# Patient Record
Sex: Male | Born: 1955 | Race: White | Hispanic: No | Marital: Married | Smoking: Current every day smoker
Health system: Southern US, Community
[De-identification: ages and names within clinical notes are randomized; demographics above are authoritative.]

## PROBLEM LIST (undated history)

## (undated) DIAGNOSIS — R079 Chest pain, unspecified: Secondary | ICD-10-CM

## (undated) DIAGNOSIS — N400 Enlarged prostate without lower urinary tract symptoms: Secondary | ICD-10-CM

## (undated) DIAGNOSIS — E119 Type 2 diabetes mellitus without complications: Secondary | ICD-10-CM

## (undated) DIAGNOSIS — M109 Gout, unspecified: Secondary | ICD-10-CM

## (undated) DIAGNOSIS — I10 Essential (primary) hypertension: Secondary | ICD-10-CM

## (undated) DIAGNOSIS — R9439 Abnormal result of other cardiovascular function study: Secondary | ICD-10-CM

## (undated) DIAGNOSIS — E785 Hyperlipidemia, unspecified: Secondary | ICD-10-CM

## (undated) HISTORY — DX: Type 2 diabetes mellitus without complications: E11.9

## (undated) HISTORY — PX: COLON RESECTION: SHX5231

## (undated) HISTORY — DX: Essential (primary) hypertension: I10

## (undated) HISTORY — DX: Hyperlipidemia, unspecified: E78.5

## (undated) HISTORY — DX: Abnormal result of other cardiovascular function study: R94.39

## (undated) HISTORY — DX: Gout, unspecified: M10.9

## (undated) HISTORY — PX: OTHER SURGICAL HISTORY: SHX169

## (undated) HISTORY — PX: CAROTID STENT: SHX1301

## (undated) HISTORY — DX: Chest pain, unspecified: R07.9

## (undated) HISTORY — DX: Benign prostatic hyperplasia without lower urinary tract symptoms: N40.0

---

## 2015-07-14 DIAGNOSIS — Z85038 Personal history of other malignant neoplasm of large intestine: Secondary | ICD-10-CM | POA: Diagnosis not present

## 2015-11-28 DIAGNOSIS — I209 Angina pectoris, unspecified: Secondary | ICD-10-CM

## 2015-11-28 HISTORY — DX: Angina pectoris, unspecified: I20.9

## 2015-12-29 ENCOUNTER — Ambulatory Visit (INDEPENDENT_AMBULATORY_CARE_PROVIDER_SITE_OTHER): Payer: BLUE CROSS/BLUE SHIELD | Admitting: Endocrinology

## 2015-12-29 ENCOUNTER — Encounter: Payer: Self-pay | Admitting: Endocrinology

## 2015-12-29 VITALS — BP 130/88 | HR 80 | Ht 65.0 in | Wt 214.0 lb

## 2015-12-29 DIAGNOSIS — N183 Chronic kidney disease, stage 3 (moderate): Secondary | ICD-10-CM

## 2015-12-29 DIAGNOSIS — E162 Hypoglycemia, unspecified: Secondary | ICD-10-CM

## 2015-12-29 DIAGNOSIS — M109 Gout, unspecified: Secondary | ICD-10-CM | POA: Diagnosis not present

## 2015-12-29 DIAGNOSIS — I10 Essential (primary) hypertension: Secondary | ICD-10-CM | POA: Diagnosis not present

## 2015-12-29 DIAGNOSIS — E785 Hyperlipidemia, unspecified: Secondary | ICD-10-CM

## 2015-12-29 DIAGNOSIS — E119 Type 2 diabetes mellitus without complications: Secondary | ICD-10-CM | POA: Insufficient documentation

## 2015-12-29 DIAGNOSIS — N400 Enlarged prostate without lower urinary tract symptoms: Secondary | ICD-10-CM | POA: Insufficient documentation

## 2015-12-29 DIAGNOSIS — E1122 Type 2 diabetes mellitus with diabetic chronic kidney disease: Secondary | ICD-10-CM

## 2015-12-29 LAB — GLUCOSE, POCT (MANUAL RESULT ENTRY): POC Glucose: 64 mg/dl — AB (ref 70–99)

## 2015-12-29 MED ORDER — BROMOCRIPTINE MESYLATE 2.5 MG PO TABS: ORAL_TABLET | ORAL | Status: DC

## 2015-12-29 MED ORDER — REPAGLINIDE 2 MG PO TABS: 2.0000 mg | ORAL_TABLET | Freq: Three times a day (TID) | ORAL | Status: DC

## 2015-12-29 NOTE — Progress Notes (Signed)
Subjective:    Patient ID: Tony Allen, male    DOB: 1955-11-05, 60 y.o.   MRN: [id number]  HPI pt states DM was dx'ed in 2012; he has moderate neuropathy of the lower extremities; he has associated renal failure and CAD; he has never been on insulin; pt says his diet and exercise are not good; he has never had pancreatitis, severe hypoglycemia or DKA.  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  He takes victoza and 3 oral meds.  He says cbg's are well-controlled. Past Medical History  Diagnosis Date  . Diabetes (HCC)   . Hypertension   . Gout   . BPH (benign prostatic hyperplasia)   . Dyslipidemia     Past Surgical History  Procedure Laterality Date  . Carotid stent    . Colon mass      REMOVED 2 YEARS AGO    Social History   Social History  . Marital Status: Married    Spouse Name: N/A  . Number of Children: N/A  . Years of Education: N/A   Occupational History  . Not on file.   Social History Main Topics  . Smoking status: Never Smoker   . Smokeless tobacco: Not on file  . Alcohol Use: Yes     Comment: 6 BEERS WEEKLY  . Drug Use: Not on file  . Sexual Activity: Not on file   Other Topics Concern  . Not on file   Social History Narrative  . No narrative on file    No current outpatient prescriptions on file prior to visit.   No current facility-administered medications on file prior to visit.    No Known Allergies  Family History  Problem Relation Age of Onset  . Diabetes Mother   . Hypertension Mother   . Colon cancer Father   . Prostate cancer Brother     BP 130/88 mmHg  Pulse 80  Ht 5\' 5"  (1.651 m)  Wt 214 lb (97.07 kg)  BMI 35.61 kg/m2  SpO2 97%   Review of Systems denies blurry vision, headache, chest pain, sob, n/v, urinary frequency, excessive diaphoresis, memory loss, cold intolerance, rhinorrhea, and easy bruising.  He has lost weight, due to his efforts.  He has leg cramps.       Objective:   Physical Exam VS: see vs page GEN: no  distress HEAD: head: no deformity eyes: no periorbital swelling, no proptosis external nose and ears are normal mouth: no lesion seen NECK: supple, thyroid is not enlarged CHEST WALL: no deformity LUNGS: clear to auscultation CV: reg rate and rhythm, no murmur ABD: abdomen is soft, nontender.  no hepatosplenomegaly.  not distended.  Self reducing ventral hernia MUSCULOSKELETAL: muscle bulk and strength are grossly normal.  no obvious joint swelling.  gait is normal and steady EXTEMITIES: no deformity.  no ulcer on the feet.  feet are of normal color and temp.  trace edema.  There is bilateral onychomycosis of the toenails.   PULSES: dorsalis pedis intact bilat.  no carotid bruit.   NEURO:  cn 2-12 grossly intact.   readily moves all 4's.  sensation is intact to touch on the feet.   SKIN:  Normal texture and temperature.  No rash or suspicious lesion is visible.  There is patchy hyperpigmentation of the feet NODES:  None palpable at the neck.   PSYCH: alert, well-oriented.  Does not appear anxious nor depressed.     outside test results are reviewed: A1c=7.0%  Creat=1.7  I have reviewed outside records, and summarized: Pt was noted to have good a1c.  However, he was having frequent mild hypoglycemia.       Assessment & Plan:  Type 2 DM: good a1c, however, she has frequent hypoglycemia, due to glipizide Renal failure: this limits oral DM rx options Edema: this also limits oral DM rx options Occupational state: he needs to control DM without insulin.     Patient is advised the following: Patient Instructions  good diet and exercise significantly improve the control of your diabetes.  please let me know if you wish to be referred to a dietician.  high blood sugar is very risky to your health.  you should see an eye doctor and dentist every year.  It is very important to get all recommended vaccinations.  Controlling your blood pressure and cholesterol drastically reduces the damage  diabetes does to your body.  Those who smoke should quit.  Please discuss these with your doctor.  check your blood sugar once a day.  vary the time of day when you check, between before the 3 meals, and at bedtime.  also check if you have symptoms of your blood sugar being too high or too low.  please keep a record of the readings and bring it to your next appointment here (or you can bring the meter itself).  You can write it on any piece of paper.  please call us sooner if your blood sugar goes below 70, or if you have a lot of readings over 200. For now, please stop taking the [place] and metformin, and:  Change glipizide to "repaglinide."  Also, please start taking "bromocriptine."  I have sent prescriptions for these to your pharmacy.   Please call in a few days, to tell us how the blood sugar is doing.   Please come back for a follow-up appointment in 2 months.     Romero Belling, MD

## 2015-12-29 NOTE — Patient Instructions (Addendum)
good diet and exercise significantly improve the control of your diabetes.  please let me know if you wish to be referred to a dietician.  high blood sugar is very risky to your health.  you should see an eye doctor and dentist every year.  It is very important to get all recommended vaccinations.  Controlling your blood pressure and cholesterol drastically reduces the damage diabetes does to your body.  Those who smoke should quit.  Please discuss these with your doctor.  check your blood sugar once a day.  vary the time of day when you check, between before the 3 meals, and at bedtime.  also check if you have symptoms of your blood sugar being too high or too low.  please keep a record of the readings and bring it to your next appointment here (or you can bring the meter itself).  You can write it on any piece of paper.  please call us sooner if your blood sugar goes below 70, or if you have a lot of readings over 200. For now, please stop taking the [place] and metformin, and:  Change glipizide to "repaglinide."  Also, please start taking "bromocriptine."  I have sent prescriptions for these to your pharmacy.   Please call in a few days, to tell us how the blood sugar is doing.   Please come back for a follow-up appointment in 2 months.

## 2015-12-30 DIAGNOSIS — Z85038 Personal history of other malignant neoplasm of large intestine: Secondary | ICD-10-CM | POA: Diagnosis not present

## 2016-03-01 ENCOUNTER — Ambulatory Visit (INDEPENDENT_AMBULATORY_CARE_PROVIDER_SITE_OTHER): Payer: BLUE CROSS/BLUE SHIELD | Admitting: Endocrinology

## 2016-03-01 ENCOUNTER — Encounter: Payer: Self-pay | Admitting: Endocrinology

## 2016-03-01 VITALS — BP 134/72 | HR 72 | Ht 65.0 in | Wt 212.0 lb

## 2016-03-01 DIAGNOSIS — E119 Type 2 diabetes mellitus without complications: Secondary | ICD-10-CM | POA: Diagnosis not present

## 2016-03-01 LAB — POCT GLYCOSYLATED HEMOGLOBIN (HGB A1C): HEMOGLOBIN A1C: 6.5

## 2016-03-01 NOTE — Patient Instructions (Addendum)

## 2016-03-01 NOTE — Progress Notes (Signed)
   Subjective:    Patient ID: Tony Allen, male    DOB: 1956-06-18, 60 y.o.   MRN: [id number]  HPI  Pt returns for f/u of diabetes mellitus: DM type: 2 Dx'ed: 2012 Complications: polyneuropathy, renal failure and CAD Therapy: victoza + 2 oral meds.  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  pt states he feels well in general.  Past Medical History:  Diagnosis Date  . BPH (benign prostatic hyperplasia)   . Diabetes (HCC)   . Dyslipidemia   . Gout   . Hypertension     Past Surgical History:  Procedure Laterality Date  . CAROTID STENT    . COLON MASS     REMOVED 2 YEARS AGO    Social History   Social History  . Marital status: Married    Spouse name: N/A  . Number of children: N/A  . Years of education: N/A   Occupational History  . Not on file.   Social History Main Topics  . Smoking status: Never Smoker  . Smokeless tobacco: Not on file  . Alcohol use Yes     Comment: 6 BEERS WEEKLY  . Drug use: Unknown  . Sexual activity: Not on file   Other Topics Concern  . Not on file   Social History Narrative  . No narrative on file    No current outpatient prescriptions on file prior to visit.   No current facility-administered medications on file prior to visit.     No Known Allergies  Family History  Problem Relation Age of Onset  . Diabetes Mother   . Hypertension Mother   . Colon cancer Father   . Prostate cancer Brother     BP 134/72   Pulse 72   Ht 5\' 5"  (1.651 m)   Wt 212 lb (96.2 kg)   SpO2 96%   BMI 35.28 kg/m    Review of Systems He denies hypoglycemia    Objective:   Physical Exam VITAL SIGNS:  See vs page GENERAL: no distress Pulses: dorsalis pedis intact bilat.   MSK: no deformity of the feet CV: 1+ bilat leg edema Skin:  no ulcer on the feet.  normal  color and temp on the feet. Neuro: sensation is intact to touch on the feet, but decreased from normal.  Ext: There is bilateral onychomycosis of the toenails.   outside test results are reviewed:  Creat=1.7.   A1c=6.5%     Assessment & Plan:  Type 2 DM: well-controlled.

## 2016-05-18 ENCOUNTER — Telehealth: Payer: Self-pay | Admitting: Endocrinology

## 2016-05-18 NOTE — Progress Notes (Signed)
Order(s) created erroneously. Erroneous order ID: [id number]  Order moved by: Amayah Staheli M  Order move date/time: 05/18/2016 10:55 AM  Source Patient: [id number]  Source Contact: 12/29/2015  Destination Patient: [id number]  Destination Contact: 09/05/2012 

## 2016-05-18 NOTE — Telephone Encounter (Signed)
See message I believe this is regarding the 12/29/2015 visit. Thanks!

## 2016-05-18 NOTE — Telephone Encounter (Signed)
Tony Allen from [place] [facility] stated you can close patient encounter.  Any questions [phone]

## 2016-06-27 NOTE — Progress Notes (Deleted)
   Subjective:    Patient ID: Tony Allen, male    DOB: 1955/08/27, 61 y.o.   MRN: [id number]  HPI Pt returns for f/u of diabetes mellitus: DM type: 2 Dx'ed: 2012 Complications: polyneuropathy, renal failure and CAD Therapy: victoza + 2 oral meds.  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  pt states he feels well in general.    Review of Systems     Objective:   Physical Exam VITAL SIGNS:  See vs page GENERAL: no distress Pulses: dorsalis pedis intact bilat.   MSK: no deformity of the feet CV: 1+ bilat leg edema Skin:  no ulcer on the feet.  normal color and temp on the feet. Neuro: sensation is intact to touch on the feet, but decreased from normal.  Ext: There is bilateral onychomycosis of the toenails.        Assessment & Plan:

## 2016-06-28 ENCOUNTER — Ambulatory Visit (INDEPENDENT_AMBULATORY_CARE_PROVIDER_SITE_OTHER): Payer: BLUE CROSS/BLUE SHIELD | Admitting: Endocrinology

## 2016-06-28 DIAGNOSIS — Z0289 Encounter for other administrative examinations: Secondary | ICD-10-CM

## 2016-07-01 DIAGNOSIS — Z85038 Personal history of other malignant neoplasm of large intestine: Secondary | ICD-10-CM | POA: Diagnosis not present

## 2016-08-13 ENCOUNTER — Ambulatory Visit: Payer: Self-pay | Admitting: Sports Medicine

## 2016-08-19 ENCOUNTER — Encounter: Payer: Self-pay | Admitting: Sports Medicine

## 2016-08-19 ENCOUNTER — Ambulatory Visit (INDEPENDENT_AMBULATORY_CARE_PROVIDER_SITE_OTHER): Payer: BLUE CROSS/BLUE SHIELD | Admitting: Sports Medicine

## 2016-08-19 DIAGNOSIS — M2040 Other hammer toe(s) (acquired), unspecified foot: Secondary | ICD-10-CM | POA: Diagnosis not present

## 2016-08-19 DIAGNOSIS — M21619 Bunion of unspecified foot: Secondary | ICD-10-CM | POA: Diagnosis not present

## 2016-08-19 DIAGNOSIS — R079 Chest pain, unspecified: Secondary | ICD-10-CM | POA: Insufficient documentation

## 2016-08-19 DIAGNOSIS — N200 Calculus of kidney: Secondary | ICD-10-CM

## 2016-08-19 DIAGNOSIS — G4733 Obstructive sleep apnea (adult) (pediatric): Secondary | ICD-10-CM

## 2016-08-19 DIAGNOSIS — G4730 Sleep apnea, unspecified: Secondary | ICD-10-CM | POA: Insufficient documentation

## 2016-08-19 DIAGNOSIS — E1142 Type 2 diabetes mellitus with diabetic polyneuropathy: Secondary | ICD-10-CM

## 2016-08-19 DIAGNOSIS — R0602 Shortness of breath: Secondary | ICD-10-CM

## 2016-08-19 DIAGNOSIS — L97511 Non-pressure chronic ulcer of other part of right foot limited to breakdown of skin: Secondary | ICD-10-CM

## 2016-08-19 HISTORY — DX: Calculus of kidney: N20.0

## 2016-08-19 HISTORY — DX: Shortness of breath: R06.02

## 2016-08-19 HISTORY — DX: Obstructive sleep apnea (adult) (pediatric): G47.33

## 2016-08-19 NOTE — Progress Notes (Addendum)
Subjective: Tony Allen is a 61 y.o. male patient seen in office for evaluation of ulceration of the right 2nd toe. Patient has a history of diabetes and a blood glucose level today not recorded but runs 130-150.  Patient is changing the dressing using bactroban as given by PCP. Patient is also taking clindamycin as given by PCP. Denies nausea/fever/vomiting/chills/night sweats/shortness of breath/pain. Patient has no other pedal complaints at this time.  Patient Active Problem List   Diagnosis Date Noted  . Chest pain 08/19/2016  . Kidney stone 08/19/2016  . Shortness of breath 08/19/2016  . Sleep apnea 08/19/2016  . Gout   . Hypertension   . Diabetes mellitus without complication (HCC)   . Diabetes mellitus (HCC)   . BPH (benign prostatic hyperplasia)   . Dyslipidemia   . 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 allopurinol (ZYLOPRIM) 100 MG tablet   5  . amLODipine (NORVASC) 10 MG tablet   0  . aspirin EC 81 MG tablet Take 81 mg by mouth.    . BRILINTA 90 MG TABS tablet   2  . bromocriptine (PARLODEL) 2.5 MG tablet   2  . carvedilol (COREG) 25 MG tablet   1  . fenofibrate 160 MG tablet   0  . furosemide (LASIX) 20 MG tablet   0  . losartan (COZAAR) 50 MG tablet   0  . meloxicam (MOBIC) 7.5 MG tablet   0  . omeprazole (PRILOSEC) 40 MG capsule Take 40 mg by mouth daily.    . pravastatin (PRAVACHOL) 40 MG tablet   0  . repaglinide (PRANDIN) 2 MG tablet   1  . tamsulosin (FLOMAX) 0.4 MG CAPS capsule   2  . VICTOZA 18 MG/3ML SOPN   0   No current facility-administered medications on file prior to visit.    No Known Allergies  No results found for this or any previous visit (from the past 2160 hour(s)).  Objective: There were no vitals filed for this visit.  General: Patient is awake, alert, oriented x 3 and in no acute distress.  Dermatology: Skin is warm and dry bilateral with a  partial thickness ulceration present Right medial 2nd toe. Ulceration measures 0.5cm x 0.6cm x0.1 cm. There is a macerated border with a granular base. The ulceration does not probe to bone. There is no malodor, no active drainage, no erythema, no edema. No acute signs of infection.   Vascular: Dorsalis Pedis pulse = 2/4 Bilateral,  Posterior Tibial pulse =1 /4 Bilateral,  Capillary Fill Time < 5 seconds  Neurologic: Protective sensation diminished bilateral. using  the 5.07/10g Semmes Weinstein Monofilament.  Musculosketal: No Pain with palpation to ulcerated area. No pain with compression to calves bilateral. + Bunion and hammertoe deformity.   Assessment and Plan:  Problem List Items Addressed This Visit    None    Visit Diagnoses    Toe ulcer, right, limited to breakdown of skin (HCC)    -  Primary   Diabetic polyneuropathy associated with type 2 diabetes mellitus (HCC)       Relevant Medications   Liraglutide (VICTOZA [place])   JANUVIA 100 MG tablet   Bunion       Hammer toe, unspecified laterality           -Examined patient and discussed the progression of the wound and treatment alternatives. - Cleansed ulceration with saline moistened guaze -  Applied topical antibiotic cream and dry sterile dressing and instructed patient to continue with daily dressings at home consisting of bactroban and 2x2 fluff and coban -Dispensed post op shoe to prevent rubbing of toes to assist with ulcer healing - Advised patient to go to the ER or return to office if the wound worsens or if constitutional symptoms are present. -Patient to return to office in 2 weeks for follow up care and evaluation or sooner if problems arise. If not better will Xray and try PRISMA.  Asencion Islam, DPM

## 2016-09-03 ENCOUNTER — Encounter: Payer: Self-pay | Admitting: Sports Medicine

## 2016-09-03 ENCOUNTER — Ambulatory Visit (INDEPENDENT_AMBULATORY_CARE_PROVIDER_SITE_OTHER): Payer: BLUE CROSS/BLUE SHIELD

## 2016-09-03 ENCOUNTER — Ambulatory Visit (INDEPENDENT_AMBULATORY_CARE_PROVIDER_SITE_OTHER): Payer: BLUE CROSS/BLUE SHIELD | Admitting: Sports Medicine

## 2016-09-03 DIAGNOSIS — E1142 Type 2 diabetes mellitus with diabetic polyneuropathy: Secondary | ICD-10-CM | POA: Diagnosis not present

## 2016-09-03 DIAGNOSIS — L97511 Non-pressure chronic ulcer of other part of right foot limited to breakdown of skin: Secondary | ICD-10-CM

## 2016-09-03 DIAGNOSIS — M2040 Other hammer toe(s) (acquired), unspecified foot: Secondary | ICD-10-CM | POA: Diagnosis not present

## 2016-09-03 DIAGNOSIS — M21619 Bunion of unspecified foot: Secondary | ICD-10-CM

## 2016-09-03 NOTE — Progress Notes (Signed)
Subjective: Tony Allen is a 61 y.o. male patient seen in office for follow up evaluation of ulceration of the right 2nd toe. Patient has a history of diabetes and a blood glucose level today not recorded; Patient is changing the dressing using bactroban as instructed. Finished Clindamycin with no issues. Denies nausea/fever/vomiting/chills/night sweats/shortness of breath/pain. Patient has no other pedal complaints at this time.  Patient Active Problem List   Diagnosis Date Noted  . Chest pain 08/19/2016  . Kidney stone 08/19/2016  . Shortness of breath 08/19/2016  . Sleep apnea 08/19/2016  . Gout   . Hypertension   . Diabetes mellitus without complication (HCC)   . Diabetes mellitus (HCC)   . BPH (benign prostatic hyperplasia)   . Dyslipidemia   . 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 allopurinol (ZYLOPRIM) 100 MG tablet   5  . amLODipine (NORVASC) 10 MG tablet   0  . aspirin EC 81 MG tablet Take 81 mg by mouth.    . BRILINTA 90 MG TABS tablet   2  . bromocriptine (PARLODEL) 2.5 MG tablet   2  . carvedilol (COREG) 25 MG tablet   1  . clindamycin (CLEOCIN) 300 MG capsule   0  . fenofibrate 160 MG tablet   0  . furosemide (LASIX) 20 MG tablet   0  . JANUVIA 100 MG tablet   3  . Liraglutide (VICTOZA [place]) Inject into the skin.    Marland Kitchen. losartan (COZAAR) 50 MG tablet   0  . meloxicam (MOBIC) 7.5 MG tablet   0  . mupirocin ointment (BACTROBAN) 2 %   0  . nitroGLYCERIN (NITROSTAT) 0.4 MG SL tablet Place 0.4 mg under the tongue.    Marland Kitchen. omeprazole (PRILOSEC) 40 MG capsule Take 40 mg by mouth daily.    . pravastatin (PRAVACHOL) 40 MG tablet   0  . repaglinide (PRANDIN) 2 MG tablet   1  . tamsulosin (FLOMAX) 0.4 MG CAPS capsule   2  . VICTOZA 18 MG/3ML SOPN   0   No current facility-administered medications on file prior to visit.    No Known Allergies  No results found for this or any previous  visit (from the past 2160 hour(s)).  Objective: There were no vitals filed for this visit.  General: Patient is awake, alert, oriented x 3 and in no acute distress.  Dermatology: Skin is warm and dry bilateral with a partial thickness ulceration present Right medial 2nd toe. Ulceration measures 0.4cm x 0.4cm x0.1 cm. There is a macerated border with a granular base. The ulceration does not probe to bone. There is no malodor, no active drainage, no erythema, no edema. No acute signs of infection.   Vascular: Dorsalis Pedis pulse = 2/4 Bilateral,  Posterior Tibial pulse =1 /4 Bilateral,  Capillary Fill Time < 5 seconds  Neurologic: Protective sensation diminished bilateral. using  the 5.07/10g Semmes Weinstein Monofilament.  Musculosketal: No Pain with palpation to ulcerated area. No pain with compression to calves bilateral. + Bunion and hammertoe deformity.   Xray right foot- diffuse arthritis, no other acute findings or bony destruction at area of ulceration.   Assessment and Plan:  Problem List Items Addressed This Visit    None    Visit Diagnoses    Toe ulcer, right, limited to breakdown of skin (HCC)    -  Primary   Relevant Orders  DG Foot 2 Views Right (Completed)   Diabetic polyneuropathy associated with type 2 diabetes mellitus (HCC)       Relevant Orders   DG Foot 2 Views Right (Completed)   Bunion       Relevant Orders   DG Foot 2 Views Right (Completed)   Hammer toe, unspecified laterality       Relevant Orders   DG Foot 2 Views Right (Completed)     -Examined patient and discussed the progression of the wound and treatment alternatives. - Cleansed ulceration with saline moistened guaze -Applied PRISMA and dry sterile dressing and instructed patient to continue with daily dressings at home consisting of the same -Continue with post op shoe to prevent rubbing of toes to assist with ulcer healing - Advised patient to go to the ER or return to office if the wound  worsens or if constitutional symptoms are present. -Patient to return to office in 3 weeks for follow up care and evaluation or sooner if problems arise. Will decide next visit on surgery amputation vs correcting bunion and hammertoe.   Asencion Islam, DPM

## 2016-09-22 ENCOUNTER — Ambulatory Visit (INDEPENDENT_AMBULATORY_CARE_PROVIDER_SITE_OTHER): Payer: BLUE CROSS/BLUE SHIELD | Admitting: Sports Medicine

## 2016-09-22 ENCOUNTER — Encounter: Payer: Self-pay | Admitting: Sports Medicine

## 2016-09-22 ENCOUNTER — Ambulatory Visit: Payer: BLUE CROSS/BLUE SHIELD | Admitting: Sports Medicine

## 2016-09-22 DIAGNOSIS — L97511 Non-pressure chronic ulcer of other part of right foot limited to breakdown of skin: Secondary | ICD-10-CM | POA: Diagnosis not present

## 2016-09-22 DIAGNOSIS — M2040 Other hammer toe(s) (acquired), unspecified foot: Secondary | ICD-10-CM

## 2016-09-22 DIAGNOSIS — M21619 Bunion of unspecified foot: Secondary | ICD-10-CM

## 2016-09-22 DIAGNOSIS — E1142 Type 2 diabetes mellitus with diabetic polyneuropathy: Secondary | ICD-10-CM

## 2016-09-22 NOTE — Progress Notes (Signed)
Subjective: Tony Allen is a 61 y.o. male patient seen in office for follow up evaluation of ulceration of the right 2nd toe. Patient has a history of diabetes and a blood glucose level today not recorded; Patient is changing the dressing using Prisma as instructed. Denies nausea/fever/vomiting/chills/night sweats/shortness of breath/pain. Patient has no other pedal complaints at this time.  Patient Active Problem List   Diagnosis Date Noted  . Chest pain 08/19/2016  . Kidney stone 08/19/2016  . Shortness of breath 08/19/2016  . Sleep apnea 08/19/2016  . Gout   . Hypertension   . Diabetes mellitus without complication (HCC)   . Diabetes mellitus (HCC)   . BPH (benign prostatic hyperplasia)   . Dyslipidemia   . 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 allopurinol (ZYLOPRIM) 100 MG tablet   5  . amLODipine (NORVASC) 10 MG tablet   0  . aspirin EC 81 MG tablet Take 81 mg by mouth.    . BRILINTA 90 MG TABS tablet   2  . bromocriptine (PARLODEL) 2.5 MG tablet   2  . carvedilol (COREG) 25 MG tablet   1  . clindamycin (CLEOCIN) 300 MG capsule   0  . fenofibrate 160 MG tablet   0  . furosemide (LASIX) 20 MG tablet   0  . JANUVIA 100 MG tablet   3  . Liraglutide (VICTOZA [place]) Inject into the skin.    Marland Kitchen losartan (COZAAR) 50 MG tablet   0  . meloxicam (MOBIC) 7.5 MG tablet   0  . mupirocin ointment (BACTROBAN) 2 %   0  . nitroGLYCERIN (NITROSTAT) 0.4 MG SL tablet Place 0.4 mg under the tongue.    Marland Kitchen omeprazole (PRILOSEC) 40 MG capsule Take 40 mg by mouth daily.    . pravastatin (PRAVACHOL) 40 MG tablet   0  . repaglinide (PRANDIN) 2 MG tablet   1  . tamsulosin (FLOMAX) 0.4 MG CAPS capsule   2  . VICTOZA 18 MG/3ML SOPN   0   No current facility-administered medications on file prior to visit.    No Known Allergies  No results found for this or any previous visit (from the past 2160  hour(s)).  Objective: There were no vitals filed for this visit.  General: Patient is awake, alert, oriented x 3 and in no acute distress.  Dermatology: Skin is warm and dry bilateral with a partial thickness ulceration present Right medial 2nd toe. Ulceration measures 0.3cm x 0.4cm x0.1 cm. There is a macerated border with a granular base. The ulceration does not probe to bone. There is no malodor, no active drainage, no erythema, no edema. No acute signs of infection.   Vascular: Dorsalis Pedis pulse = 2/4 Bilateral,  Posterior Tibial pulse =1 /4 Bilateral,  Capillary Fill Time < 5 seconds  Neurologic: Protective sensation diminished bilateral. using  the 5.07/10g Semmes Weinstein Monofilament.  Musculosketal: No Pain with palpation to ulcerated area. No pain with compression to calves bilateral. + Bunion and hammertoe deformity.    Assessment and Plan:  Problem List Items Addressed This Visit    None    Visit Diagnoses    Toe ulcer, right, limited to breakdown of skin (HCC)    -  Primary   Diabetic polyneuropathy associated with type 2 diabetes mellitus (HCC)       Bunion       Hammer toe, unspecified laterality         -  Examined patient and discussed the progression of the wound and treatment alternatives. - Cleansed ulceration with saline moistened guaze -Applied PRISMA and dry sterile dressing and instructed patient to continue with daily dressings at home consisting of the same -Continue with post op shoe to prevent rubbing of toes to assist with ulcer healing - Advised patient to go to the ER or return to office if the wound worsens or if constitutional symptoms are present. -Patient to return to office in 3-4weeks for follow up care and evaluation or sooner if problems arise. Will decide next visit on surgery amputation vs correcting bunion and hammertoe. However, at this time. Patient wants to hold off on surgery and give the toe a few more weeks to heal.  Asencion Islam,  DPM

## 2016-10-15 ENCOUNTER — Ambulatory Visit: Payer: BLUE CROSS/BLUE SHIELD | Admitting: Sports Medicine

## 2016-10-22 ENCOUNTER — Ambulatory Visit: Payer: BLUE CROSS/BLUE SHIELD | Admitting: Sports Medicine

## 2017-09-05 DIAGNOSIS — I2510 Atherosclerotic heart disease of native coronary artery without angina pectoris: Secondary | ICD-10-CM | POA: Insufficient documentation

## 2017-09-05 DIAGNOSIS — Z7982 Long term (current) use of aspirin: Secondary | ICD-10-CM

## 2017-09-05 DIAGNOSIS — E782 Mixed hyperlipidemia: Secondary | ICD-10-CM

## 2017-09-05 DIAGNOSIS — I10 Essential (primary) hypertension: Secondary | ICD-10-CM

## 2017-09-05 HISTORY — DX: Long term (current) use of aspirin: Z79.82

## 2017-09-05 HISTORY — DX: Mixed hyperlipidemia: E78.2

## 2017-09-05 HISTORY — DX: Essential (primary) hypertension: I10

## 2017-09-05 HISTORY — DX: Atherosclerotic heart disease of native coronary artery without angina pectoris: I25.10

## 2018-03-16 DIAGNOSIS — Z720 Tobacco use: Secondary | ICD-10-CM

## 2018-03-16 HISTORY — DX: Tobacco use: Z72.0

## 2020-10-02 DIAGNOSIS — Z6836 Body mass index (BMI) 36.0-36.9, adult: Secondary | ICD-10-CM

## 2020-10-02 DIAGNOSIS — M25559 Pain in unspecified hip: Secondary | ICD-10-CM

## 2020-10-02 DIAGNOSIS — M5416 Radiculopathy, lumbar region: Secondary | ICD-10-CM

## 2020-10-02 DIAGNOSIS — G629 Polyneuropathy, unspecified: Secondary | ICD-10-CM

## 2020-10-02 HISTORY — DX: Polyneuropathy, unspecified: G62.9

## 2020-10-02 HISTORY — DX: Body mass index (BMI) 36.0-36.9, adult: Z68.36

## 2020-10-02 HISTORY — DX: Radiculopathy, lumbar region: M54.16

## 2020-10-02 HISTORY — DX: Pain in unspecified hip: M25.559

## 2021-03-17 ENCOUNTER — Other Ambulatory Visit: Payer: Self-pay

## 2021-04-02 ENCOUNTER — Encounter: Payer: Self-pay | Admitting: Cardiology

## 2021-04-02 ENCOUNTER — Other Ambulatory Visit: Payer: Self-pay

## 2021-04-02 ENCOUNTER — Ambulatory Visit (INDEPENDENT_AMBULATORY_CARE_PROVIDER_SITE_OTHER): Payer: Medicaid Other | Admitting: Cardiology

## 2021-04-02 VITALS — BP 136/70 | HR 81 | Ht 66.0 in | Wt 211.0 lb

## 2021-04-02 DIAGNOSIS — R072 Precordial pain: Secondary | ICD-10-CM

## 2021-04-02 DIAGNOSIS — E782 Mixed hyperlipidemia: Secondary | ICD-10-CM | POA: Diagnosis not present

## 2021-04-02 DIAGNOSIS — I2510 Atherosclerotic heart disease of native coronary artery without angina pectoris: Secondary | ICD-10-CM

## 2021-04-02 DIAGNOSIS — I209 Angina pectoris, unspecified: Secondary | ICD-10-CM

## 2021-04-02 DIAGNOSIS — I10 Essential (primary) hypertension: Secondary | ICD-10-CM

## 2021-04-02 DIAGNOSIS — E119 Type 2 diabetes mellitus without complications: Secondary | ICD-10-CM

## 2021-04-02 HISTORY — DX: Angina pectoris, unspecified: I20.9

## 2021-04-02 MED ORDER — ASPIRIN EC 81 MG PO TBEC: 81.0000 mg | DELAYED_RELEASE_TABLET | Freq: Every day | ORAL | 3 refills | Status: AC

## 2021-04-02 MED ORDER — NITROGLYCERIN 0.4 MG SL SUBL: 0.4000 mg | SUBLINGUAL_TABLET | SUBLINGUAL | 6 refills | Status: DC | PRN

## 2021-04-02 NOTE — Patient Instructions (Signed)
Medication Instructions:  Your physician has recommended you make the following change in your medication:   Start taking 81 mg coated aspirin daily.  Use nitroglycerin 1 tablet placed under the tongue at the first sign of chest pain or an angina attack. 1 tablet may be used every 5 minutes as needed, for up to 15 minutes. Do not take more than 3 tablets in 15 minutes. If pain persist call 911 or go to the nearest ED.   *If you need a refill on your cardiac medications before your next appointment, please call your pharmacy*   Lab Work: None ordered If you have labs (blood work) drawn today and your tests are completely normal, you will receive your results only by: MyChart Message (if you have MyChart) OR A paper copy in the mail If you have any lab test that is abnormal or we need to change your treatment, we will call you to review the results.   Testing/Procedures: Your physician has requested that you have a lexiscan myoview. For further information please visit [email or web address] Please follow instruction sheet, as given.  The test will take approximately 3 to 4 hours to complete; you may bring reading material.  If someone comes with you to your appointment, they will need to remain in the main lobby due to limited space in the testing area.   How to prepare for your Myocardial Perfusion Test: Do not eat or drink 3 hours prior to your test, except you may have water. Do not consume products containing caffeine (regular or decaffeinated) 12 hours prior to your test. (ex: coffee, chocolate, sodas, tea). Do bring a list of your current medications with you.  If not listed below, you may take your medications as normal. Do wear comfortable clothes (no dresses or overalls) and walking shoes, tennis shoes preferred (No heels or open toe shoes are allowed). Do NOT wear cologne, perfume, aftershave, or lotions (deodorant is allowed). If these instructions are not followed, your test  will have to be rescheduled.  Your physician has requested that you have an echocardiogram. Echocardiography is a painless test that uses sound waves to create images of your heart. It provides your doctor with information about the size and shape of your heart and how well your heart's chambers and valves are working. This procedure takes approximately one hour. There are no restrictions for this procedure.   Follow-Up: At [facility], you and your health needs are our priority.  As part of our continuing mission to provide you with exceptional heart care, we have created designated Provider Care Teams.  These Care Teams include your primary Cardiologist (physician) and Advanced Practice Providers (APPs -  Physician Assistants and Nurse Practitioners) who all work together to provide you with the care you need, when you need it.  We recommend signing up for the patient portal called "MyChart".  Sign up information is provided on this After Visit Summary.  MyChart is used to connect with patients for Virtual Visits (Telemedicine).  Patients are able to view lab/test results, encounter notes, upcoming appointments, etc.  Non-urgent messages can be sent to your provider as well.   To learn more about what you can do with MyChart, go to [email or web address].    Your next appointment:   6 month(s)  The format for your next appointment:   In Person  Provider:   Belva Crome, MD   Other Instructions Cardiac Nuclear Scan A cardiac nuclear scan is a test that is done to check  the flow of blood to your heart. It is done when you are resting and when you are exercising. The test looks for problems such as: Not enough blood reaching a portion of the heart. The heart muscle not working as it should. You may need this test if: You have heart disease. You have had lab results that are not normal. You have had heart surgery or a balloon procedure to open up blocked arteries (angioplasty). You  have chest pain. You have shortness of breath. In this test, a special dye (tracer) is put into your bloodstream. The tracer will travel to your heart. A camera will then take pictures of your heart to see how the tracer moves through your heart. This test is usually done at a hospital and takes 2-4 hours. Tell a doctor about: Any allergies you have. All medicines you are taking, including vitamins, herbs, eye drops, creams, and over-the-counter medicines. Any problems you or family members have had with anesthetic medicines. Any blood disorders you have. Any surgeries you have had. Any medical conditions you have. Whether you are pregnant or may be pregnant. What are the risks? Generally, this is a safe test. However, problems may occur, such as: Serious chest pain and heart attack. This is only a risk if the stress portion of the test is done. Rapid heartbeat. A feeling of warmth in your chest. This feeling usually does not last long. Allergic reaction to the tracer. What happens before the test? Ask your doctor about changing or stopping your normal medicines. This is important. Follow instructions from your doctor about what you cannot eat or drink. Remove your jewelry on the day of the test. What happens during the test? An IV tube will be inserted into one of your veins. Your doctor will give you a small amount of tracer through the IV tube. You will wait for 20-40 minutes while the tracer moves through your bloodstream. Your heart will be monitored with an electrocardiogram (ECG). You will lie down on an exam table. Pictures of your heart will be taken for about 15-20 minutes. You may also have a stress test. For this test, one of these things may be done: You will be asked to exercise on a treadmill or a stationary bike. You will be given medicines that will make your heart work harder. This is done if you are unable to exercise. When blood flow to your heart has peaked, a  tracer will again be given through the IV tube. After 20-40 minutes, you will get back on the exam table. More pictures will be taken of your heart. Depending on the tracer that is used, more pictures may need to be taken 3-4 hours later. Your IV tube will be removed when the test is over. The test may vary among doctors and hospitals. What happens after the test? Ask your doctor: Whether you can return to your normal schedule, including diet, activities, and medicines. Whether you should drink more fluids. This will help to remove the tracer from your body. Drink enough fluid to keep your pee (urine) pale yellow. Ask your doctor, or the department that is doing the test: When will my results be ready? How will I get my results? Summary A cardiac nuclear scan is a test that is done to check the flow of blood to your heart. Tell your doctor whether you are pregnant or may be pregnant. Before the test, ask your doctor about changing or stopping your normal medicines. This is  important. Ask your doctor whether you can return to your normal activities. You may be asked to drink more fluids. This information is not intended to replace advice given to you by your health care provider. Make sure you discuss any questions you have with your health care provider. Document Revised: 09/27/2018 Document Reviewed: 11/21/2017 Elsevier Patient Education  2021 Elsevier Inc.    Echocardiogram An echocardiogram is a test that uses sound waves (ultrasound) to produce images of the heart. Images from an echocardiogram can provide important information about: Heart size and shape. The size and thickness and movement of your heart's walls. Heart muscle function and strength. Heart valve function or if you have stenosis. Stenosis is when the heart valves are too narrow. If blood is flowing backward through the heart valves (regurgitation). A tumor or infectious growth around the heart valves. Areas of  heart muscle that are not working well because of poor blood flow or injury from a heart attack. Aneurysm detection. An aneurysm is a weak or damaged part of an artery wall. The wall bulges out from the normal force of blood pumping through the body. Tell a health care provider about: Any allergies you have. All medicines you are taking, including vitamins, herbs, eye drops, creams, and over-the-counter medicines. Any blood disorders you have. Any surgeries you have had. Any medical conditions you have. Whether you are pregnant or may be pregnant. What are the risks? Generally, this is a safe test. However, problems may occur, including an allergic reaction to dye (contrast) that may be used during the test. What happens before the test? No specific preparation is needed. You may eat and drink normally. What happens during the test? You will take off your clothes from the waist up and put on a hospital gown. Electrodes or electrocardiogram (ECG)patches may be placed on your chest. The electrodes or patches are then connected to a device that monitors your heart rate and rhythm. You will lie down on a table for an ultrasound exam. A gel will be applied to your chest to help sound waves pass through your skin. A handheld device, called a transducer, will be pressed against your chest and moved over your heart. The transducer produces sound waves that travel to your heart and bounce back (or "echo" back) to the transducer. These sound waves will be captured in real-time and changed into images of your heart that can be viewed on a video monitor. The images will be recorded on a computer and reviewed by your health care provider. You may be asked to change positions or hold your breath for a short time. This makes it easier to get different views or better views of your heart. In some cases, you may receive contrast through an IV in one of your veins. This can improve the quality of the pictures from  your heart. The procedure may vary among health care providers and hospitals.    What can I expect after the test? You may return to your normal, everyday life, including diet, activities, and medicines, unless your health care provider tells you not to do that. Follow these instructions at home: It is up to you to get the results of your test. Ask your health care provider, or the department that is doing the test, when your results will be ready. Keep all follow-up visits. This is important. Summary An echocardiogram is a test that uses sound waves (ultrasound) to produce images of the heart. Images from an echocardiogram can  provide important information about the size and shape of your heart, heart muscle function, heart valve function, and other possible heart problems. You do not need to do anything to prepare before this test. You may eat and drink normally. After the echocardiogram is completed, you may return to your normal, everyday life, unless your health care provider tells you not to do that. This information is not intended to replace advice given to you by your health care provider. Make sure you discuss any questions you have with your health care provider. Document Revised: 01/29/2020 Document Reviewed: 01/29/2020 Elsevier Patient Education  2021 [employer].

## 2021-04-02 NOTE — Progress Notes (Signed)
Cardiology Office Note:    Date:  04/02/2021   ID:  Tony Allen, Tony Allen 01-Apr-1956, MRN [id number]  PCP:  Marin Comment, FNP  Cardiologist:  Garwin Brothers, MD   Referring MD: Marin Comment, FNP    ASSESSMENT:    1. Chronic coronary artery disease   2. Essential hypertension   3. Mixed hyperlipidemia   4. Precordial pain   5. Angina pectoris (HCC)   6. Diabetes mellitus without complication (HCC)    PLAN:    In order of problems listed above:  Coronary artery disease and angina pectoris: Secondary prevention stressed with the patient.  Importance of compliance with diet medication stressed any vocalized understanding.  He is not taking aspirin for unclear reasons and I told him to start a coated baby aspirin on a daily basis.  He has no known contraindications.  Sublingual nitroglycerin prescription was sent, its protocol and 911 protocol explained and the patient vocalized understanding questions were answered to the patient's satisfaction.  We will do a Lexiscan sestamibi to understand the nature of his symptoms and is agreeable. Essential hypertension: Blood pressure stable and diet was emphasized.  Lifestyle modification urged. Mixed dyslipidemia and diabetes mellitus: Diet was emphasized.  Also risks of obesity explained weight reduction was stressed and he promises to do better. Cardiac murmur: Echocardiogram will be done to assess murmur heard on auscultation. Patient will be seen in follow-up appointment in 6 months or earlier if the patient has any concerns.  He knows to go to the nearest emergency room for any concerning symptoms.  Lipids followed by primary care.   Medication Adjustments/Labs and Tests Ordered: Current medicines are reviewed at length with the patient today.  Concerns regarding medicines are outlined above.  Orders Placed This Encounter  Procedures   MYOCARDIAL PERFUSION IMAGING   EKG 12-Lead   ECHOCARDIOGRAM COMPLETE   Meds ordered this  encounter  Medications   aspirin EC 81 MG tablet    Sig: Take 1 tablet (81 mg total) by mouth daily. Swallow whole.    Dispense:  90 tablet    Refill:  3   nitroGLYCERIN (NITROSTAT) 0.4 MG SL tablet    Sig: Place 1 tablet (0.4 mg total) under the tongue every 5 (five) minutes as needed.    Dispense:  25 tablet    Refill:  6     History of Present Illness:    Tony Allen is a 65 y.o. male who is being seen today for the evaluation of chest pain and known coronary artery disease at the request of Marin Comment, FNP.  Patient is a pleasant 65 year old male.  He has past medical history of coronary artery disease post stenting in the remote past, essential hypertension, dyslipidemia and diabetes mellitus.  He leads a sedentary lifestyle.  He tells me that he is an ex-smoker and does not smoke at this time.  He gives history of some chest tightness on exertion.  He tells me that he cannot do a whole lot overall because of his general deconditioning.  At the time of my evaluation, the patient is alert awake oriented and in no distress.  No radiation of the symptoms to the neck or to the arms.  Past Medical History:  Diagnosis Date   Angina, class II (HCC) 11/28/2015   BPH (benign prostatic hyperplasia)    Chest pain    Chronic coronary artery disease 09/05/2017   Diabetes mellitus (HCC)    Diabetes mellitus without complication (HCC)  Dyslipidemia    Essential hypertension 09/05/2017   Gout    Hypertension    Kidney stone 08/19/2016   Long-term use of aspirin therapy 09/05/2017   Mixed hyperlipidemia 09/05/2017   Obstructive sleep apnea 08/19/2016   Overview:  sleep study showed sleep apnea-pt noncompliant and does not have CPAP   Shortness of breath 08/19/2016   Overview:  with activity   Tobacco abuse 03/16/2018    Past Surgical History:  Procedure Laterality Date   CAROTID STENT     COLON MASS     REMOVED 2 YEARS AGO   COLON RESECTION      Current Medications: Current Meds   Medication Sig   allopurinol (ZYLOPRIM) 300 MG tablet Take 300 mg by mouth 2 (two) times daily.   amLODipine (NORVASC) 10 MG tablet Take 10 mg by mouth daily.   aspirin EC 81 MG tablet Take 1 tablet (81 mg total) by mouth daily. Swallow whole.   bromocriptine (PARLODEL) 2.5 MG tablet Take 0.25 tablets by mouth daily.   carvedilol (COREG) 25 MG tablet Take 25 mg by mouth 2 (two) times daily.   celecoxib (CELEBREX) 200 MG capsule Take 200 mg by mouth 2 (two) times daily.   escitalopram (LEXAPRO) 20 MG tablet Take 20 mg by mouth daily.   furosemide (LASIX) 20 MG tablet Take 20 mg by mouth daily.   gabapentin (NEURONTIN) 300 MG capsule Take 300 mg by mouth 2 (two) times daily.   JARDIANCE 25 MG TABS tablet Take 25 mg by mouth every morning.   losartan (COZAAR) 100 MG tablet Take 100 mg by mouth daily.   metoprolol succinate (TOPROL-XL) 25 MG 24 hr tablet Take 25 mg by mouth daily.   nitroGLYCERIN (NITROSTAT) 0.4 MG SL tablet Place 1 tablet (0.4 mg total) under the tongue every 5 (five) minutes as needed.   omeprazole (PRILOSEC) 40 MG capsule Take 40 mg by mouth daily.   potassium chloride (KLOR-CON) 10 MEQ tablet Take 10 mEq by mouth daily.   pravastatin (PRAVACHOL) 40 MG tablet Take 40 mg by mouth daily.   repaglinide (PRANDIN) 2 MG tablet Take 2 mg by mouth 3 (three) times daily.   tamsulosin (FLOMAX) 0.4 MG CAPS capsule Take 0.4 mg by mouth daily.   TRULICITY 3 MG/0.5ML SOPN Inject 3 mg into the skin once a week.     Allergies:   Patient has no known allergies.   Social History   Socioeconomic History   Marital status: Married    Spouse name: Not on file   Number of children: Not on file   Years of education: Not on file   Highest education level: Not on file  Occupational History   Not on file  Tobacco Use   Smoking status: Every Day    Types: Cigarettes   Smokeless tobacco: Never  Substance and Sexual Activity   Alcohol use: Yes    Comment: 6 BEERS WEEKLY   Drug use: Not  on file   Sexual activity: Not on file  Other Topics Concern   Not on file  Social History Narrative   Not on file   Social Determinants of Health   Financial Resource Strain: Not on file  Food Insecurity: Not on file  Transportation Needs: Not on file  Physical Activity: Not on file  Stress: Not on file  Social Connections: Not on file     Family History: The patient's family history includes Colon cancer in his father; Diabetes in his mother; Hypertension in  his mother; Prostate cancer in his brother.  ROS:   Please see the history of present illness.    All other systems reviewed and are negative.  EKGs/Labs/Other Studies Reviewed:    The following studies were reviewed today: EKG reveals sinus rhythm and nonspecific ST-T changes   Recent Labs: No results found for requested labs within last 8760 hours.  Recent Lipid Panel No results found for: CHOL, TRIG, HDL, CHOLHDL, VLDL, LDLCALC, LDLDIRECT  Physical Exam:    VS:  BP 136/70   Pulse 81   Ht 5\' 6"  (1.676 m)   Wt 211 lb (95.7 kg)   SpO2 96%   BMI 34.06 kg/m     Wt Readings from Last 3 Encounters:  04/02/21 211 lb (95.7 kg)  03/01/16 212 lb (96.2 kg)  12/29/15 214 lb (97.1 kg)     GEN: Patient is in no acute distress HEENT: Normal NECK: No JVD; No carotid bruits LYMPHATICS: No lymphadenopathy CARDIAC: S1 S2 regular, 2/6 systolic murmur at the apex. RESPIRATORY:  Clear to auscultation without rales, wheezing or rhonchi  ABDOMEN: Soft, non-tender, non-distended MUSCULOSKELETAL:  No edema; No deformity  SKIN: Warm and dry NEUROLOGIC:  Alert and oriented x 3 PSYCHIATRIC:  Normal affect    Signed, 02/29/16, MD  04/02/2021 2:10 PM    [facility]

## 2021-04-15 ENCOUNTER — Telehealth: Payer: Self-pay

## 2021-04-15 NOTE — Telephone Encounter (Signed)
Spoke with the patient, detailed instructions given. He stated that he would be here for his test. Asked to call back with any questions. S.Saron Tweed EMTP 

## 2021-04-22 ENCOUNTER — Telehealth: Payer: Self-pay

## 2021-04-22 ENCOUNTER — Ambulatory Visit (INDEPENDENT_AMBULATORY_CARE_PROVIDER_SITE_OTHER): Payer: Medicaid Other

## 2021-04-22 ENCOUNTER — Ambulatory Visit: Payer: Medicaid Other

## 2021-04-22 ENCOUNTER — Telehealth (HOSPITAL_COMMUNITY): Payer: Self-pay | Admitting: *Deleted

## 2021-04-22 ENCOUNTER — Encounter (HOSPITAL_COMMUNITY): Payer: Self-pay | Admitting: *Deleted

## 2021-04-22 ENCOUNTER — Other Ambulatory Visit: Payer: Self-pay

## 2021-04-22 DIAGNOSIS — I2510 Atherosclerotic heart disease of native coronary artery without angina pectoris: Secondary | ICD-10-CM | POA: Diagnosis not present

## 2021-04-22 DIAGNOSIS — I77810 Thoracic aortic ectasia: Secondary | ICD-10-CM

## 2021-04-22 LAB — ECHOCARDIOGRAM COMPLETE
Area-P 1/2: 3.36 cm2
Height: 66 in
Weight: 3376 oz

## 2021-04-22 MED ORDER — PERFLUTREN LIPID MICROSPHERE
1.0000 mL | INTRAVENOUS | Status: AC | PRN
  Administered 2021-04-22: 7 mL via INTRAVENOUS

## 2021-04-22 NOTE — Telephone Encounter (Signed)
Gave instructions to patient and discussed.  Patient acknowledged understanding.

## 2021-04-22 NOTE — Telephone Encounter (Signed)
Spoke with patient regarding results and recommendation.  Patient verbalizes understanding and is agreeable to plan of care. Advised patient to call back with any issues or concerns.  

## 2021-04-22 NOTE — Telephone Encounter (Signed)
-----   Message from Garwin Brothers, MD sent at 04/22/2021  2:57 PM EDT ----- Needs CT chest to assess size of ascending aorta.  Otherwise the results of the study is unremarkable. Please inform patient. I will discuss in detail at next appointment. Cc  primary care/referring physician Garwin Brothers, MD 04/22/2021 2:56 PM

## 2021-04-30 ENCOUNTER — Ambulatory Visit (INDEPENDENT_AMBULATORY_CARE_PROVIDER_SITE_OTHER): Payer: Medicaid Other

## 2021-04-30 ENCOUNTER — Other Ambulatory Visit: Payer: Self-pay

## 2021-04-30 DIAGNOSIS — I2510 Atherosclerotic heart disease of native coronary artery without angina pectoris: Secondary | ICD-10-CM | POA: Diagnosis not present

## 2021-04-30 DIAGNOSIS — R072 Precordial pain: Secondary | ICD-10-CM | POA: Diagnosis not present

## 2021-04-30 LAB — MYOCARDIAL PERFUSION IMAGING
LV dias vol: 122 mL (ref 62–150)
LV sys vol: 56 mL
Nuc Stress EF: 54 %
Peak HR: 81 {beats}/min
Rest HR: 69 {beats}/min
Rest Nuclear Isotope Dose: 8.6 mCi
SDS: 4
SRS: 3
SSS: 7
Stress Nuclear Isotope Dose: 28.3 mCi
TID: 1.23

## 2021-04-30 MED ORDER — REGADENOSON 0.4 MG/5ML IV SOLN
0.4000 mg | Freq: Once | INTRAVENOUS | Status: AC
  Administered 2021-04-30: 0.4 mg via INTRAVENOUS

## 2021-04-30 MED ORDER — TECHNETIUM TC 99M TETROFOSMIN IV KIT
8.6000 | PACK | Freq: Once | INTRAVENOUS | Status: AC | PRN
  Administered 2021-04-30: 8.6 via INTRAVENOUS

## 2021-04-30 MED ORDER — TECHNETIUM TC 99M TETROFOSMIN IV KIT
28.3000 | PACK | Freq: Once | INTRAVENOUS | Status: AC | PRN
  Administered 2021-04-30: 28.3 via INTRAVENOUS

## 2021-05-04 ENCOUNTER — Other Ambulatory Visit: Payer: Self-pay

## 2021-05-05 ENCOUNTER — Other Ambulatory Visit: Payer: Self-pay

## 2021-05-05 ENCOUNTER — Ambulatory Visit: Payer: Medicaid Other | Admitting: Cardiology

## 2021-05-05 ENCOUNTER — Encounter: Payer: Self-pay | Admitting: Cardiology

## 2021-05-05 VITALS — BP 136/76 | HR 80 | Ht 66.0 in | Wt 208.0 lb

## 2021-05-05 DIAGNOSIS — E782 Mixed hyperlipidemia: Secondary | ICD-10-CM

## 2021-05-05 DIAGNOSIS — I2510 Atherosclerotic heart disease of native coronary artery without angina pectoris: Secondary | ICD-10-CM | POA: Diagnosis not present

## 2021-05-05 DIAGNOSIS — I209 Angina pectoris, unspecified: Secondary | ICD-10-CM

## 2021-05-05 DIAGNOSIS — I10 Essential (primary) hypertension: Secondary | ICD-10-CM | POA: Diagnosis not present

## 2021-05-05 DIAGNOSIS — E119 Type 2 diabetes mellitus without complications: Secondary | ICD-10-CM

## 2021-05-05 DIAGNOSIS — E781 Pure hyperglyceridemia: Secondary | ICD-10-CM

## 2021-05-05 MED ORDER — CLOPIDOGREL BISULFATE 75 MG PO TABS: 75.0000 mg | ORAL_TABLET | Freq: Every day | ORAL | 3 refills | Status: DC

## 2021-05-05 NOTE — Patient Instructions (Signed)
Medication Instructions:  Your physician has recommended you make the following change in your medication:   Take 81 mg coated aspirin daily. Use nitroglycerin as needed for chest pain.  *If you need a refill on your cardiac medications before your next appointment, please call your pharmacy*   Lab Work: Your physician recommends that you have a BMET and CBC today in the office for your upcoming procedure.  If you have labs (blood work) drawn today and your tests are completely normal, you will receive your results only by: MyChart Message (if you have MyChart) OR A paper copy in the mail If you have any lab test that is abnormal or we need to change your treatment, we will call you to review the results.   Testing/Procedures:  [facility] CARDIOVASCULAR DIVISION [facility] [place] [address] [place] [address] Dept: [phone] Loc: [phone]  Tony Allen  05/05/2021  You are scheduled for a Cardiac Catheterization on Thursday, November 17 with Dr. Nanetta Batty.  1. Please arrive at the [facility] (Main Entrance A) at [facility]: [address] [place], [place] [address] at 9:30 AM (This time is two hours before your procedure to ensure your preparation). Free valet parking service is available.   Special note: Every effort is made to have your procedure done on time. Please understand that emergencies sometimes delay scheduled procedures.  2. Diet: Do not eat solid foods after midnight.  The patient may have clear liquids until 5am upon the day of the procedure.  3. Labs: You had your labs done today in the office.  4. Medication instructions in preparation for your procedure:   Contrast Allergy: No   Stop taking, Cozaar (Losartan) Thursday, November 17,, Celebrex (Celecoxib) Wednesday, November 16,, Lasix (Furosemide)  Thursday, November 17,  On the morning of your procedure, take your  Aspirin and Plavix/Clopidogrel and any morning medicines NOT listed above.  You may use sips of water.  5. Plan for one night stay--bring personal belongings. 6. Bring a current list of your medications and current insurance cards. 7. You MUST have a responsible person to drive you home. 8. Someone MUST be with you the first 24 hours after you arrive home or your discharge will be delayed. 9. Please wear clothes that are easy to get on and off and wear slip-on shoes.  Thank you for allowing [place] to care for you!   -- [facility]    Follow-Up: At [facility], you and your health needs are our priority.  As part of our continuing mission to provide you with exceptional heart care, we have created designated Provider Care Teams.  These Care Teams include your primary Cardiologist (physician) and Advanced Practice Providers (APPs -  Physician Assistants and Nurse Practitioners) who all work together to provide you with the care you need, when you need it.  We recommend signing up for the patient portal called "MyChart".  Sign up information is provided on this After Visit Summary.  MyChart is used to connect with patients for Virtual Visits (Telemedicine).  Patients are able to view lab/test results, encounter notes, upcoming appointments, etc.  Non-urgent messages can be sent to your provider as well.   To learn more about what you can do with MyChart, go to [email or web address].    Your next appointment:   1 month(s)  The format for your next appointment:   In Person  Provider:  Belva Crome, MD   Other Instructions  Coronary Angiogram With Stent Coronary angiogram with stent placement is a procedure to widen or open a narrow blood vessel of the heart (coronary artery). Arteries may become blocked by cholesterol buildup (plaques) in the lining of the artery wall. When a coronary artery becomes partially blocked, blood flow to that area decreases.  This may lead to chest pain or a heart attack (myocardial infarction). A stent is a small piece of metal that looks like mesh or spring. Stent placement may be done as treatment after a heart attack, or to prevent a heart attack if a blocked artery is found by a coronary angiogram. Let your health care provider know about: Any allergies you have, including allergies to medicines or contrast dye. All medicines you are taking, including vitamins, herbs, eye drops, creams, and over-the-counter medicines. Any problems you or family members have had with anesthetic medicines. Any blood disorders you have. Any surgeries you have had. Any medical conditions you have, including kidney problems or kidney failure. Whether you are pregnant or may be pregnant. Whether you are breastfeeding. What are the risks? Generally, this is a safe procedure. However, serious problems may occur, including: Damage to nearby structures or organs, such as the heart, blood vessels, or kidneys. A return of blockage. Bleeding, infection, or bruising at the insertion site. A collection of blood under the skin (hematoma) at the insertion site. A blood clot in another part of the body. Allergic reaction to medicines or dyes. Bleeding into the abdomen (retroperitoneal bleeding). Stroke (rare). Heart attack (rare). What happens before the procedure? Staying hydrated Follow instructions from your health care provider about hydration, which may include: Up to 2 hours before the procedure - you may continue to drink clear liquids, such as water, clear fruit juice, black coffee, and plain tea.    Eating and drinking restrictions Follow instructions from your health care provider about eating and drinking, which may include: 8 hours before the procedure - stop eating heavy meals or foods, such as meat, fried foods, or fatty foods. 6 hours before the procedure - stop eating light meals or foods, such as toast or cereal. 2  hours before the procedure - stop drinking clear liquids. Medicines Ask your health care provider about: Changing or stopping your regular medicines. This is especially important if you are taking diabetes medicines or blood thinners. Taking medicines such as aspirin and ibuprofen. These medicines can thin your blood. Do not take these medicines unless your health care provider tells you to take them. Generally, aspirin is recommended before a thin tube, called a catheter, is passed through a blood vessel and inserted into the heart (cardiac catheterization). Taking over-the-counter medicines, vitamins, herbs, and supplements. General instructions Do not use any products that contain nicotine or tobacco for at least 4 weeks before the procedure. These products include cigarettes, e-cigarettes, and chewing tobacco. If you need help quitting, ask your health care provider. Plan to have someone take you home from the hospital or clinic. If you will be going home right after the procedure, plan to have someone with you for 24 hours. You may have tests and imaging procedures. Ask your health care provider: How your insertion site will be marked. Ask which artery will be used for the procedure. What steps will be taken to help prevent infection. These may include: Removing hair at the insertion site. Washing skin with a germ-killing soap. Taking antibiotic medicine. What happens during the procedure? An  IV will be inserted into one of your veins. Electrodes may be placed on your chest to monitor your heart rate during the procedure. You will be given one or more of the following: A medicine to help you relax (sedative). A medicine to numb the area (local anesthetic) for catheter insertion. A small incision will be made for catheter insertion. The catheter will be inserted into an artery using a guide wire. The location may be in your groin, your wrist, or the fold of your arm (near your  elbow). An X-ray procedure (fluoroscopy) will be used to help guide the catheter to the opening of the heart arteries. A dye will be injected into the catheter. X-rays will be taken. The dye helps to show where any narrowing or blockages are located in the arteries. Tell your health care provider if you have chest pain or trouble breathing. A tiny wire will be guided to the blocked spot, and a balloon will be inflated to make the artery wider. The stent will be expanded to crush the plaques into the wall of the vessel. The stent will hold the area open and improve the blood flow. Most stents have a drug coating to reduce the risk of the stent narrowing over time. The artery may be made wider using a drill, laser, or other tools that remove plaques. The catheter will be removed when the blood flow improves. The stent will stay where it was placed, and the lining of the artery will grow over it. A bandage (dressing) will be placed on the insertion site. Pressure will be applied to stop bleeding. The IV will be removed. This procedure may vary among health care providers and hospitals.    What happens after the procedure? Your blood pressure, heart rate, breathing rate, and blood oxygen level will be monitored until you leave the hospital or clinic. If the procedure is done through the leg, you will lie flat in bed for a few hours or for as long as told by your health care provider. You will be instructed not to bend or cross your legs. The insertion site and the pulse in your foot or wrist will be checked often. You may have more blood tests, X-rays, and a test that records the electrical activity of your heart (electrocardiogram, or ECG). Do not drive for 24 hours if you were given a sedative during your procedure. Summary Coronary angiogram with stent placement is a procedure to widen or open a narrowed coronary artery. This is done to treat heart problems. Before the procedure, let your health  care provider know about all the medical conditions and surgeries you have or have had. This is a safe procedure. However, some problems may occur, including damage to nearby structures or organs, bleeding, blood clots, or allergies. Follow your health care provider's instructions about eating, drinking, medicines, and other lifestyle changes, such as quitting tobacco use before the procedure. This information is not intended to replace advice given to you by your health care provider. Make sure you discuss any questions you have with your health care provider. Document Revised: 12/27/2018 Document Reviewed: 12/27/2018 Elsevier Patient Education  2021 Elsevier Inc.  Aspirin and Your Heart Aspirin is a medicine that prevents the platelets in your blood from sticking together. Platelets are the cells that your blood uses for clotting. Aspirin can be used to help reduce the risk of blood clots, heart attacks, and other heart-related problems. What are the risks? Daily use of aspirin can cause  side effects. Some of these include: Bleeding. Bleeding can be minor or serious. An example of minor bleeding is bleeding from a cut, and the bleeding does not stop. An example of more serious bleeding is stomach bleeding or, rarely, bleeding into the brain. Your risk of bleeding increases if you are also taking NSAIDs, such as ibuprofen. Increased bruising. Upset stomach. An allergic reaction. People who have growths inside the nose (nasal polyps) have an increased risk of developing an aspirin allergy. How to use aspirin to care for your heart Take aspirin only as told by your health care provider. Make sure that you understand how much to take and what form to take. The two forms of aspirin are: Non-enteric-coated.This type of aspirin does not have a coating and is absorbed quickly. This type of aspirin also comes in a chewable form. Enteric-coated. This type of aspirin has a coating that releases the  medicine very slowly. Enteric-coated aspirin might cause less stomach upset than non-enteric-coated aspirin. This type of aspirin should not be chewed or crushed. Work with your health care provider to find out whether it is safe and beneficial for you to take aspirin daily. Taking aspirin daily may be helpful if: You have had a heart attack or chest pain, or you are at risk for a heart attack. You have a condition in which certain heart vessels are blocked (coronary artery disease), and you have had a procedure to treat it. Examples are: Open-heart surgery, such as coronary artery bypass surgery (CABG). Coronary angioplasty,which is done to widen a blood vessel of your heart. Having a small mesh tube, or stent, placed in your coronary artery. You have had certain types of stroke or a mini-stroke known as a transient ischemic attack (TIA). You have a narrowing of the arteries that supply the limbs (peripheral artery disease, or PAD). You have long-term (chronic) heart rhythm problems, such as atrial fibrillation, and your health care provider thinks aspirin may help. You have valve disease or have had surgery on a valve. You are considered at increased risk of developing coronary artery disease or PAD.    Follow these instructions at home Medicines Take over-the-counter and prescription medicines only as told by your health care provider. If you are taking blood thinners: Talk with your health care provider before you take any medicines that contain aspirin or NSAIDs, such as ibuprofen. These medicines increase your risk for dangerous bleeding. Take your medicine exactly as told, at the same time every day. Avoid activities that could cause injury or bruising, and follow instructions about how to prevent falls. Wear a medical alert bracelet or carry a card that lists what medicines you take. General instructions Do not drink alcohol if: Your health care provider tells you not to drink. You  are pregnant, may be pregnant, or are planning to become pregnant. If you drink alcohol: Limit how much you use to: 0-1 drink a day for women. 0-2 drinks a day for men. Be aware of how much alcohol is in your drink. In the [place] one drink equals one 12 oz bottle of beer (355 mL), one 5 oz glass of wine (148 mL), or one 1 oz glass of hard liquor (44 mL). Keep all follow-up visits as told by your health care provider. This is important. Where to find more information The American Heart Association: www.heart.org Contact a health care provider if you have: Unusual bleeding or bruising. Stomach pain or nausea. Ringing in your ears. An allergic reaction that  causes hives, itchy skin, or swelling of the lips, tongue, or face. Get help right away if: You notice that your bowel movements are bloody, or dark red or black in color. You vomit or cough up blood. You have blood in your urine. You cough, breathe loudly (wheeze), or feel short of breath. You have chest pain, especially if the pain spreads to your arms, back, neck, or jaw. You have a headache with confusion. You have any symptoms of a stroke. "BE FAST" is an easy way to remember the main warning signs of a stroke: B - Balance. Signs are dizziness, sudden trouble walking, or loss of balance. E - Eyes. Signs are trouble seeing or a sudden change in vision. F - Face. Signs are sudden weakness or numbness of the face, or the face or eyelid drooping on one side. A - Arms. Signs are weakness or numbness in an arm. This happens suddenly and usually on one side of the body. S - Speech. Signs are sudden trouble speaking, slurred speech, or trouble understanding what people say. T - Time. Time to call emergency services. Write down what time symptoms started. You have other signs of a stroke, such as: A sudden, severe headache with no known cause. Nausea or vomiting. Seizure. These symptoms may represent a serious problem that is an  emergency. Do not wait to see if the symptoms will go away. Get medical help right away. Call your local emergency services (911 in the [place] Do not drive yourself to the hospital. Summary Aspirin use can help reduce the risk of blood clots, heart attacks, and other heart-related problems. Daily use of aspirin can cause side effects. Take aspirin only as told by your health care provider. Make sure that you understand how much to take and what form to take. Your health care provider will help you determine whether it is safe and beneficial for you to take aspirin daily. This information is not intended to replace advice given to you by your health care provider. Make sure you discuss any questions you have with your health care provider. Document Revised: 03/12/2019 Document Reviewed: 03/12/2019 Elsevier Patient Education  2021 Elsevier Inc. Nitroglycerin sublingual tablets What is this medicine? NITROGLYCERIN (nye troe GLI ser in) is a type of vasodilator. It relaxes blood vessels, increasing the blood and oxygen supply to your heart. This medicine is used to relieve chest pain caused by angina. It is also used to prevent chest pain before activities like climbing stairs, going outdoors in cold weather, or sexual activity. This medicine may be used for other purposes; ask your health care provider or pharmacist if you have questions. COMMON BRAND NAME(S): Nitroquick, Nitrostat, Nitrotab What should I tell my health care provider before I take this medicine? They need to know if you have any of these conditions: anemia head injury, recent stroke, or bleeding in the brain liver disease previous heart attack an unusual or allergic reaction to nitroglycerin, other medicines, foods, dyes, or preservatives pregnant or trying to get pregnant breast-feeding How should I use this medicine? Take this medicine by mouth as needed. Use at the first sign of an angina attack (chest pain or tightness).  You can also take this medicine 5 to 10 minutes before an event likely to produce chest pain. Follow the directions exactly as written on the prescription label. Place one tablet under your tongue and let it dissolve. Do not swallow whole. Replace the dose if you accidentally swallow it. It will help  if your mouth is not dry. Saliva around the tablet will help it to dissolve more quickly. Do not eat or drink, smoke or chew tobacco while a tablet is dissolving. Sit down when taking this medicine. In an angina attack, you should feel better within 5 minutes after your first dose. You can take a dose every 5 minutes up to a total of 3 doses. If you do not feel better or feel worse after 1 dose, call 9-1-1 at once. Do not take more than 3 doses in 15 minutes. Your health care provider might give you other directions. Follow those directions if he or she does. Do not take your medicine more often than directed. Talk to your health care provider about the use of this medicine in children. Special care may be needed. Overdosage: If you think you have taken too much of this medicine contact a poison control center or emergency room at once. NOTE: This medicine is only for you. Do not share this medicine with others. What if I miss a dose? This does not apply. This medicine is only used as needed. What may interact with this medicine? Do not take this medicine with any of the following medications: certain migraine medicines like ergotamine and dihydroergotamine (DHE) medicines used to treat erectile dysfunction like sildenafil, tadalafil, and vardenafil riociguat This medicine may also interact with the following medications: alteplase aspirin heparin medicines for high blood pressure medicines for mental depression other medicines used to treat angina phenothiazines like chlorpromazine, mesoridazine, prochlorperazine, thioridazine This list may not describe all possible interactions. Give your health  care provider a list of all the medicines, herbs, non-prescription drugs, or dietary supplements you use. Also tell them if you smoke, drink alcohol, or use illegal drugs. Some items may interact with your medicine. What should I watch for while using this medicine? Tell your doctor or health care professional if you feel your medicine is no longer working. Keep this medicine with you at all times. Sit or lie down when you take your medicine to prevent falling if you feel dizzy or faint after using it. Try to remain calm. This will help you to feel better faster. If you feel dizzy, take several deep breaths and lie down with your feet propped up, or bend forward with your head resting between your knees. You may get drowsy or dizzy. Do not drive, use machinery, or do anything that needs mental alertness until you know how this drug affects you. Do not stand or sit up quickly, especially if you are an older patient. This reduces the risk of dizzy or fainting spells. Alcohol can make you more drowsy and dizzy. Avoid alcoholic drinks. Do not treat yourself for coughs, colds, or pain while you are taking this medicine without asking your doctor or health care professional for advice. Some ingredients may increase your blood pressure. What side effects may I notice from receiving this medicine? Side effects that you should report to your doctor or health care professional as soon as possible: allergic reactions (skin rash, itching or hives; swelling of the face, lips, or tongue) low blood pressure (dizziness; feeling faint or lightheaded, falls; unusually weak or tired) low red blood cell counts (trouble breathing; feeling faint; lightheaded, falls; unusually weak or tired) Side effects that usually do not require medical attention (report to your doctor or health care professional if they continue or are bothersome): facial flushing (redness) headache nausea, vomiting This list may not describe all  possible side effects.  Call your doctor for medical advice about side effects. You may report side effects to FDA at 1-800-FDA-1088. Where should I keep my medicine? Keep out of the reach of children. Store at room temperature between 20 and 25 degrees C (68 and 77 degrees F). Store in Retail buyer. Protect from light and moisture. Keep tightly closed. Throw away any unused medicine after the expiration date. NOTE: This sheet is a summary. It may not cover all possible information. If you have questions about this medicine, talk to your doctor, pharmacist, or health care provider.  2021 Elsevier/Gold Standard (2018-03-08 16:46:32)

## 2021-05-05 NOTE — Progress Notes (Signed)
Cardiology Office Note:    Date:  05/05/2021   ID:  Tony Allen, Tony Allen 1956/02/04, MRN [id number]  PCP:  Suzan Garibaldi, FNP  Cardiologist:  Jenean Lindau, MD   Referring MD: Suzan Garibaldi, FNP    ASSESSMENT:    1. Angina pectoris ([place])   2. Chronic coronary artery disease   3. Essential (primary) hypertension   4. Mixed hyperlipidemia   5. Diabetes mellitus without complication (HCC)    PLAN:    In order of problems listed above:  Coronary artery disease post circumflex stenting more than 5 years ago.  This was done at [facility].  Patient now has angina and stress test is abnormal.  He does take aspirin.  Today I have added clopidogrel 75 mg to his regimen.  Sublingual nitroglycerin prescription was sent, its protocol and 911 protocol explained and the patient vocalized understanding questions were answered to the patient's satisfaction.  In view of these findings following recommendations were made.I discussed coronary angiography and left heart catheterization with the patient at extensive length. Procedure, benefits and potential risks were explained. Patient had multiple questions which were answered to the patient's satisfaction. Patient agreed and consented for the procedure. Further recommendations will be made based on the findings of the coronary angiography. In the interim. The patient has any significant symptoms he knows to go to the nearest emergency room. Essential hypertension: Blood pressure stable and diet was emphasized.  Lifestyle modification urged. Mixed dyslipidemia: Patient on statin therapy and lipids followed by primary care. Diabetes mellitus and obesity: Lifestyle modification urged he promises to do better with diet and eventually exercise after his coronary angiography is done and we we will at that point recommend him graded exercise. Will be seen in follow-up appointment after the coronary angiography.  Patient had multiple  questions which were answered to his satisfaction.   Medication Adjustments/Labs and Tests Ordered: Current medicines are reviewed at length with the patient today.  Concerns regarding medicines are outlined above.  No orders of the defined types were placed in this encounter.  No orders of the defined types were placed in this encounter.    No chief complaint on file.    History of Present Illness:    Tony Allen is a 65 y.o. male.  Patient has past medical history of coronary artery disease post circumflex stenting many years ago, essential hypertension, mixed dyslipidemia, diabetes mellitus and obesity.  He leads a sedentary lifestyle.  He mentions to me that he does not smoke.  He gives history of chest tightness on exertion at times.  His stress test is abnormal and detailed below.  For this reason he was called for follow-up appointment.  At the time of my evaluation, the patient is alert awake oriented and in no distress.  Past Medical History:  Diagnosis Date   Angina pectoris ([place]) 04/02/2021   Angina, class II ([place]) 11/28/2015   Body mass index (BMI) 36.0-36.9, adult 10/02/2020   BPH (benign prostatic hyperplasia)    Chest pain    Chronic coronary artery disease 09/05/2017   Diabetes mellitus ([place])    Diabetes mellitus without complication ([place])    Dyslipidemia    Essential (primary) hypertension 09/05/2017   Essential hypertension 09/05/2017   Gout    Hip pain 10/02/2020   Hypertension    Kidney stone 08/19/2016   Long-term use of aspirin therapy 09/05/2017   Lumbar radiculopathy 10/02/2020   Mixed hyperlipidemia 09/05/2017   Obstructive sleep apnea  08/19/2016   Overview:  sleep study showed sleep apnea-pt noncompliant and does not have CPAP   Peripheral polyneuropathy 10/02/2020   Shortness of breath 08/19/2016   Overview:  with activity   Tobacco abuse 03/16/2018    Past Surgical History:  Procedure Laterality Date   CAROTID STENT     COLON MASS     REMOVED 2 YEARS  AGO   COLON RESECTION      Current Medications: Current Meds  Medication Sig   allopurinol (ZYLOPRIM) 300 MG tablet Take 300 mg by mouth as needed (gout).   amLODipine (NORVASC) 10 MG tablet Take 10 mg by mouth daily.   aspirin EC 81 MG tablet Take 1 tablet (81 mg total) by mouth daily. Swallow whole.   carvedilol (COREG) 25 MG tablet Take 25 mg by mouth 2 (two) times daily.   celecoxib (CELEBREX) 200 MG capsule Take 200 mg by mouth 2 (two) times daily.   escitalopram (LEXAPRO) 20 MG tablet Take 20 mg by mouth daily.   furosemide (LASIX) 20 MG tablet Take 20 mg by mouth daily.   gabapentin (NEURONTIN) 300 MG capsule Take 300 mg by mouth 2 (two) times daily.   JARDIANCE 25 MG TABS tablet Take 25 mg by mouth every morning.   losartan (COZAAR) 100 MG tablet Take 100 mg by mouth daily.   metoprolol succinate (TOPROL-XL) 25 MG 24 hr tablet Take 25 mg by mouth daily.   nitroGLYCERIN (NITROSTAT) 0.4 MG SL tablet Place 0.4 mg under the tongue every 5 (five) minutes as needed for chest pain.   omeprazole (PRILOSEC) 40 MG capsule Take 40 mg by mouth daily.   pravastatin (PRAVACHOL) 40 MG tablet Take 40 mg by mouth daily.   tamsulosin (FLOMAX) 0.4 MG CAPS capsule Take 0.4 mg by mouth daily.   TRULICITY 3 MG/0.5ML SOPN Inject 3 mg into the skin once a week.     Allergies:   Patient has no known allergies.   Social History   Socioeconomic History   Marital status: Married    Spouse name: Not on file   Number of children: Not on file   Years of education: Not on file   Highest education level: Not on file  Occupational History   Not on file  Tobacco Use   Smoking status: Every Day    Types: Cigarettes   Smokeless tobacco: Never  Substance and Sexual Activity   Alcohol use: Yes    Comment: 6 BEERS WEEKLY   Drug use: Not on file   Sexual activity: Not on file  Other Topics Concern   Not on file  Social History Narrative   Not on file   Social Determinants of Health   Financial  Resource Strain: Not on file  Food Insecurity: Not on file  Transportation Needs: Not on file  Physical Activity: Not on file  Stress: Not on file  Social Connections: Not on file     Family History: The patient's family history includes Colon cancer in his father; Diabetes in his mother; Hypertension in his mother; Prostate cancer in his brother.  ROS:   Please see the history of present illness.    All other systems reviewed and are negative.  EKGs/Labs/Other Studies Reviewed:    The following studies were reviewed today: Study Highlights      Findings are consistent with mild inferior peri infarction ischemia. Fixed inferior defect.The study is intermediate risk.   Left ventricular function is normal. Nuclear stress EF: 54 %. The  left ventricular ejection fraction is mildly decreased (45-54%). End diastolic cavity size is normal. Inferior segment hypokinesis.   Prior study not available for comparison.  IMPRESSIONS     1. Left ventricular ejection fraction, by estimation, is 55 to 60%. The  left ventricle has normal function. The left ventricle has no regional  wall motion abnormalities. There is moderate left ventricular hypertrophy.  Left ventricular diastolic  parameters are consistent with Grade I diastolic dysfunction (impaired  relaxation).   2. Left atrial size was moderately dilated.   3. aorta, measuring 39 mm.    Recent Labs: No results found for requested labs within last 8760 hours.  Recent Lipid Panel No results found for: CHOL, TRIG, HDL, CHOLHDL, VLDL, LDLCALC, LDLDIRECT  Physical Exam:    VS:  BP 136/76   Pulse 80   Ht 5\' 6"  (1.676 m)   Wt 208 lb (94.3 kg)   SpO2 96%   BMI 33.57 kg/m     Wt Readings from Last 3 Encounters:  05/05/21 208 lb (94.3 kg)  04/30/21 211 lb (95.7 kg)  04/22/21 211 lb (95.7 kg)     GEN: Patient is in no acute distress HEENT: Normal NECK: No JVD; No carotid bruits LYMPHATICS: No lymphadenopathy CARDIAC: Hear  sounds regular, 2/6 systolic murmur at the apex. RESPIRATORY:  Clear to auscultation without rales, wheezing or rhonchi  ABDOMEN: Soft, non-tender, non-distended MUSCULOSKELETAL:  No edema; No deformity  SKIN: Warm and dry NEUROLOGIC:  Alert and oriented x 3 PSYCHIATRIC:  Normal affect   Signed, Jenean Lindau, MD  05/05/2021 9:22 AM     [facility]

## 2021-05-05 NOTE — H&P (View-Only) (Signed)
Cardiology Office Note:    Date:  05/05/2021   ID:  Tony Allen, [name] [birth year], MRN [id number]  PCP:  Suzan Garibaldi, FNP  Cardiologist:  Jenean Lindau, MD   Referring MD: Suzan Garibaldi, FNP    ASSESSMENT:    1. Angina pectoris ([place])   2. Chronic coronary artery disease   3. Essential (primary) hypertension   4. Mixed hyperlipidemia   5. Diabetes mellitus without complication (HCC)    PLAN:    In order of problems listed above:  Coronary artery disease post circumflex stenting more than 5 years ago.  This was done at [facility].  Patient now has angina and stress test is abnormal.  He does take aspirin.  Today I have added clopidogrel 75 mg to his regimen.  Sublingual nitroglycerin prescription was sent, its protocol and 911 protocol explained and the patient vocalized understanding questions were answered to the patient's satisfaction.  In view of these findings following recommendations were made.I discussed coronary angiography and left heart catheterization with the patient at extensive length. Procedure, benefits and potential risks were explained. Patient had multiple questions which were answered to the patient's satisfaction. Patient agreed and consented for the procedure. Further recommendations will be made based on the findings of the coronary angiography. In the interim. The patient has any significant symptoms he knows to go to the nearest emergency room. Essential hypertension: Blood pressure stable and diet was emphasized.  Lifestyle modification urged. Mixed dyslipidemia: Patient on statin therapy and lipids followed by primary care. Diabetes mellitus and obesity: Lifestyle modification urged he promises to do better with diet and eventually exercise after his coronary angiography is done and we we will at that point recommend him graded exercise. Will be seen in follow-up appointment after the coronary angiography.  Patient had multiple  questions which were answered to his satisfaction.   Medication Adjustments/Labs and Tests Ordered: Current medicines are reviewed at length with the patient today.  Concerns regarding medicines are outlined above.  No orders of the defined types were placed in this encounter.  No orders of the defined types were placed in this encounter.    No chief complaint on file.    History of Present Illness:    Tony Allen is a 65 y.o. male.  Patient has past medical history of coronary artery disease post circumflex stenting many years ago, essential hypertension, mixed dyslipidemia, diabetes mellitus and obesity.  He leads a sedentary lifestyle.  He mentions to me that he does not smoke.  He gives history of chest tightness on exertion at times.  His stress test is abnormal and detailed below.  For this reason he was called for follow-up appointment.  At the time of my evaluation, the patient is alert awake oriented and in no distress.  Past Medical History:  Diagnosis Date   Angina pectoris ([place]) 04/02/2021   Angina, class II ([place]) 11/28/2015   Body mass index (BMI) 36.0-36.9, adult 10/02/2020   BPH (benign prostatic hyperplasia)    Chest pain    Chronic coronary artery disease 09/05/2017   Diabetes mellitus ([place])    Diabetes mellitus without complication ([place])    Dyslipidemia    Essential (primary) hypertension 09/05/2017   Essential hypertension 09/05/2017   Gout    Hip pain 10/02/2020   Hypertension    Kidney stone 08/19/2016   Long-term use of aspirin therapy 09/05/2017   Lumbar radiculopathy 10/02/2020   Mixed hyperlipidemia 09/05/2017   Obstructive sleep apnea  08/19/2016   Overview:  sleep study showed sleep apnea-pt noncompliant and does not have CPAP   Peripheral polyneuropathy 10/02/2020   Shortness of breath 08/19/2016   Overview:  with activity   Tobacco abuse 03/16/2018    Past Surgical History:  Procedure Laterality Date   CAROTID STENT     COLON MASS     REMOVED 2 YEARS  AGO   COLON RESECTION      Current Medications: Current Meds  Medication Sig   allopurinol (ZYLOPRIM) 300 MG tablet Take 300 mg by mouth as needed (gout).   amLODipine (NORVASC) 10 MG tablet Take 10 mg by mouth daily.   aspirin EC 81 MG tablet Take 1 tablet (81 mg total) by mouth daily. Swallow whole.   carvedilol (COREG) 25 MG tablet Take 25 mg by mouth 2 (two) times daily.   celecoxib (CELEBREX) 200 MG capsule Take 200 mg by mouth 2 (two) times daily.   escitalopram (LEXAPRO) 20 MG tablet Take 20 mg by mouth daily.   furosemide (LASIX) 20 MG tablet Take 20 mg by mouth daily.   gabapentin (NEURONTIN) 300 MG capsule Take 300 mg by mouth 2 (two) times daily.   JARDIANCE 25 MG TABS tablet Take 25 mg by mouth every morning.   losartan (COZAAR) 100 MG tablet Take 100 mg by mouth daily.   metoprolol succinate (TOPROL-XL) 25 MG 24 hr tablet Take 25 mg by mouth daily.   nitroGLYCERIN (NITROSTAT) 0.4 MG SL tablet Place 0.4 mg under the tongue every 5 (five) minutes as needed for chest pain.   omeprazole (PRILOSEC) 40 MG capsule Take 40 mg by mouth daily.   pravastatin (PRAVACHOL) 40 MG tablet Take 40 mg by mouth daily.   tamsulosin (FLOMAX) 0.4 MG CAPS capsule Take 0.4 mg by mouth daily.   TRULICITY 3 MG/0.5ML SOPN Inject 3 mg into the skin once a week.     Allergies:   Patient has no known allergies.   Social History   Socioeconomic History   Marital status: Married    Spouse name: Not on file   Number of children: Not on file   Years of education: Not on file   Highest education level: Not on file  Occupational History   Not on file  Tobacco Use   Smoking status: Every Day    Types: Cigarettes   Smokeless tobacco: Never  Substance and Sexual Activity   Alcohol use: Yes    Comment: 6 BEERS WEEKLY   Drug use: Not on file   Sexual activity: Not on file  Other Topics Concern   Not on file  Social History Narrative   Not on file   Social Determinants of Health   Financial  Resource Strain: Not on file  Food Insecurity: Not on file  Transportation Needs: Not on file  Physical Activity: Not on file  Stress: Not on file  Social Connections: Not on file     Family History: The patient's family history includes Colon cancer in his father; Diabetes in his mother; Hypertension in his mother; Prostate cancer in his brother.  ROS:   Please see the history of present illness.    All other systems reviewed and are negative.  EKGs/Labs/Other Studies Reviewed:    The following studies were reviewed today: Study Highlights      Findings are consistent with mild inferior peri infarction ischemia. Fixed inferior defect.The study is intermediate risk.   Left ventricular function is normal. Nuclear stress EF: 54 %. The  left ventricular ejection fraction is mildly decreased (45-54%). End diastolic cavity size is normal. Inferior segment hypokinesis.   Prior study not available for comparison.  IMPRESSIONS     1. Left ventricular ejection fraction, by estimation, is 55 to 60%. The  left ventricle has normal function. The left ventricle has no regional  wall motion abnormalities. There is moderate left ventricular hypertrophy.  Left ventricular diastolic  parameters are consistent with Grade I diastolic dysfunction (impaired  relaxation).   2. Left atrial size was moderately dilated.   3. aorta, measuring 39 mm.    Recent Labs: No results found for requested labs within last 8760 hours.  Recent Lipid Panel No results found for: CHOL, TRIG, HDL, CHOLHDL, VLDL, LDLCALC, LDLDIRECT  Physical Exam:    VS:  BP 136/76   Pulse 80   Ht 5\' 6"  (1.676 m)   Wt 208 lb (94.3 kg)   SpO2 96%   BMI 33.57 kg/m     Wt Readings from Last 3 Encounters:  05/05/21 208 lb (94.3 kg)  04/30/21 211 lb (95.7 kg)  04/22/21 211 lb (95.7 kg)     GEN: Patient is in no acute distress HEENT: Normal NECK: No JVD; No carotid bruits LYMPHATICS: No lymphadenopathy CARDIAC: Hear  sounds regular, 2/6 systolic murmur at the apex. RESPIRATORY:  Clear to auscultation without rales, wheezing or rhonchi  ABDOMEN: Soft, non-tender, non-distended MUSCULOSKELETAL:  No edema; No deformity  SKIN: Warm and dry NEUROLOGIC:  Alert and oriented x 3 PSYCHIATRIC:  Normal affect   Signed, Jenean Lindau, MD  05/05/2021 9:22 AM    [facility]

## 2021-05-06 ENCOUNTER — Telehealth: Payer: Self-pay | Admitting: *Deleted

## 2021-05-06 ENCOUNTER — Telehealth: Payer: Self-pay | Admitting: Cardiology

## 2021-05-06 LAB — CBC WITH DIFFERENTIAL/PLATELET
Basophils Absolute: 0.1 10*3/uL (ref 0.0–0.2)
Basos: 1 %
EOS (ABSOLUTE): 0.4 10*3/uL (ref 0.0–0.4)
Eos: 4 %
Hematocrit: 39.5 % (ref 37.5–51.0)
Hemoglobin: 13.1 g/dL (ref 13.0–17.7)
Immature Grans (Abs): 0 10*3/uL (ref 0.0–0.1)
Immature Granulocytes: 0 %
Lymphocytes Absolute: 2.1 10*3/uL (ref 0.7–3.1)
Lymphs: 25 %
MCH: 30.7 pg (ref 26.6–33.0)
MCHC: 33.2 g/dL (ref 31.5–35.7)
MCV: 93 fL (ref 79–97)
Monocytes Absolute: 0.6 10*3/uL (ref 0.1–0.9)
Monocytes: 7 %
Neutrophils Absolute: 5.4 10*3/uL (ref 1.4–7.0)
Neutrophils: 63 %
Platelets: 251 10*3/uL (ref 150–450)
RBC: 4.27 x10E6/uL (ref 4.14–5.80)
RDW: 13.4 % (ref 11.6–15.4)
WBC: 8.6 10*3/uL (ref 3.4–10.8)

## 2021-05-06 LAB — BASIC METABOLIC PANEL
BUN/Creatinine Ratio: 18 (ref 10–24)
BUN: 41 mg/dL — ABNORMAL HIGH (ref 8–27)
CO2: 22 mmol/L (ref 20–29)
Calcium: 9.6 mg/dL (ref 8.6–10.2)
Chloride: 100 mmol/L (ref 96–106)
Creatinine, Ser: 2.29 mg/dL — ABNORMAL HIGH (ref 0.76–1.27)
Glucose: 319 mg/dL — ABNORMAL HIGH (ref 70–99)
Potassium: 4.8 mmol/L (ref 3.5–5.2)
Sodium: 136 mmol/L (ref 134–144)
eGFR: 31 mL/min/{1.73_m2} — ABNORMAL LOW (ref >59)

## 2021-05-06 NOTE — Telephone Encounter (Signed)
Recommendations reviewed with pt as per Dr. Revankar's note.  Pt verbalized understanding and had no additional questions. Routed to PCP.  

## 2021-05-06 NOTE — Telephone Encounter (Signed)
Pt is returning call to Dr. Tomie China [phone]

## 2021-05-06 NOTE — Telephone Encounter (Signed)
Cardiac catheterization scheduled at [facility] for: Thursday May 07, 2021 11:30 AM [facility] Main Entrance A [facility]) at: 6:30 AM-pre-procedure hydration   No solid food after midnight prior to cath, clear liquids until 5 AM day of procedure.  Medication instructions: Hold: Losartan-day before and day of procedure-GFR 31-per protocol Lasix-day before and day of procedure-GFR 31-per protocol Celebrex-day before and day of procedure-GFR 31-per protocol Jardiance-AM of procedure  Except hold medications usual morning medications can be taken pre-cath with sips of water including: - aspirin 81 mg -Plavix 75 mg    Confirmed patient has responsible adult to drive home post procedure and be with patient first 24 hours after arriving home.  [facility] does allow one visitor to accompany you and wait in the hospital waiting room while you are there for your procedure. You and your visitor will be asked to wear a mask once you enter the hospital.   Patient reports does not currently have any new symptoms concerning for COVID-19 and no household members with COVID-19 like illness.     Patient unavailable by telephone-reviewed procedure/mask/visitor instructions with patient's wife (DPR), Pam.

## 2021-05-06 NOTE — Telephone Encounter (Signed)
Reviewed procedure/mask/visitor instructions with patient.  Patient reports he has already taken lasix and losartan today, instructed to hold in the morning, will hold Celebrex tonight and tomorrow, will hold Jardiance in the morning, knows to take Plavix and aspirin in the morning.

## 2021-05-07 ENCOUNTER — Other Ambulatory Visit: Payer: Self-pay

## 2021-05-07 ENCOUNTER — Encounter (HOSPITAL_COMMUNITY)
Admission: RE | Disposition: A | Discharge status: Home / Self Care | Payer: Self-pay | Source: Ambulatory Visit | Attending: Cardiovascular Disease

## 2021-05-07 ENCOUNTER — Ambulatory Visit (HOSPITAL_COMMUNITY)
Admission: RE | Admit: 2021-05-07 | Discharge: 2021-05-08 | Disposition: A | Discharge status: Home / Self Care | Payer: Medicaid Other | Source: Ambulatory Visit | Attending: Cardiovascular Disease | Admitting: Cardiovascular Disease

## 2021-05-07 DIAGNOSIS — Z23 Encounter for immunization: Secondary | ICD-10-CM | POA: Diagnosis not present

## 2021-05-07 DIAGNOSIS — I209 Angina pectoris, unspecified: Secondary | ICD-10-CM | POA: Diagnosis present

## 2021-05-07 DIAGNOSIS — R9439 Abnormal result of other cardiovascular function study: Secondary | ICD-10-CM | POA: Diagnosis not present

## 2021-05-07 DIAGNOSIS — Z20822 Contact with and (suspected) exposure to covid-19: Secondary | ICD-10-CM | POA: Diagnosis not present

## 2021-05-07 DIAGNOSIS — E785 Hyperlipidemia, unspecified: Secondary | ICD-10-CM | POA: Diagnosis present

## 2021-05-07 DIAGNOSIS — Z955 Presence of coronary angioplasty implant and graft: Secondary | ICD-10-CM | POA: Insufficient documentation

## 2021-05-07 DIAGNOSIS — I2582 Chronic total occlusion of coronary artery: Secondary | ICD-10-CM | POA: Diagnosis not present

## 2021-05-07 DIAGNOSIS — E119 Type 2 diabetes mellitus without complications: Secondary | ICD-10-CM

## 2021-05-07 DIAGNOSIS — I10 Essential (primary) hypertension: Secondary | ICD-10-CM | POA: Diagnosis present

## 2021-05-07 DIAGNOSIS — N189 Chronic kidney disease, unspecified: Secondary | ICD-10-CM | POA: Diagnosis not present

## 2021-05-07 DIAGNOSIS — I25119 Atherosclerotic heart disease of native coronary artery with unspecified angina pectoris: Secondary | ICD-10-CM

## 2021-05-07 DIAGNOSIS — Z7982 Long term (current) use of aspirin: Secondary | ICD-10-CM | POA: Insufficient documentation

## 2021-05-07 DIAGNOSIS — Z6833 Body mass index (BMI) 33.0-33.9, adult: Secondary | ICD-10-CM | POA: Insufficient documentation

## 2021-05-07 DIAGNOSIS — E782 Mixed hyperlipidemia: Secondary | ICD-10-CM | POA: Insufficient documentation

## 2021-05-07 DIAGNOSIS — I129 Hypertensive chronic kidney disease with stage 1 through stage 4 chronic kidney disease, or unspecified chronic kidney disease: Secondary | ICD-10-CM | POA: Insufficient documentation

## 2021-05-07 DIAGNOSIS — E669 Obesity, unspecified: Secondary | ICD-10-CM | POA: Diagnosis not present

## 2021-05-07 DIAGNOSIS — I2510 Atherosclerotic heart disease of native coronary artery without angina pectoris: Secondary | ICD-10-CM | POA: Diagnosis present

## 2021-05-07 DIAGNOSIS — E1122 Type 2 diabetes mellitus with diabetic chronic kidney disease: Secondary | ICD-10-CM | POA: Diagnosis not present

## 2021-05-07 HISTORY — DX: Atherosclerotic heart disease of native coronary artery without angina pectoris: I25.10

## 2021-05-07 HISTORY — PX: LEFT HEART CATH AND CORONARY ANGIOGRAPHY: CATH118249

## 2021-05-07 HISTORY — PX: INTRAVASCULAR PRESSURE WIRE/FFR STUDY: CATH118243

## 2021-05-07 HISTORY — PX: CORONARY STENT INTERVENTION: CATH118234

## 2021-05-07 LAB — GLUCOSE, CAPILLARY
Glucose-Capillary: 167 mg/dL — ABNORMAL HIGH (ref 70–99)
Glucose-Capillary: 128 mg/dL — ABNORMAL HIGH (ref 70–99)
Glucose-Capillary: 206 mg/dL — ABNORMAL HIGH (ref 70–99)
Glucose-Capillary: 186 mg/dL — ABNORMAL HIGH (ref 70–99)

## 2021-05-07 LAB — RESP PANEL BY RT-PCR (FLU A&B, COVID) ARPGX2
Influenza A by PCR: NEGATIVE
Influenza B by PCR: NEGATIVE
SARS Coronavirus 2 by RT PCR: NEGATIVE

## 2021-05-07 LAB — POCT ACTIVATED CLOTTING TIME
Activated Clotting Time: 266 seconds
Activated Clotting Time: 289 seconds

## 2021-05-07 SURGERY — LEFT HEART CATH AND CORONARY ANGIOGRAPHY
Anesthesia: LOCAL

## 2021-05-07 MED ORDER — ASPIRIN 81 MG PO CHEW: 81.0000 mg | CHEWABLE_TABLET | ORAL | Status: DC

## 2021-05-07 MED ORDER — PRAVASTATIN SODIUM 40 MG PO TABS
40.0000 mg | ORAL_TABLET | Freq: Every day | ORAL | Status: DC
  Administered 2021-05-08: 40 mg via ORAL
  Filled 2021-05-07: qty 1

## 2021-05-07 MED ORDER — HEPARIN SODIUM (PORCINE) 1000 UNIT/ML IJ SOLN
INTRAMUSCULAR | Status: AC
  Filled 2021-05-07: qty 1

## 2021-05-07 MED ORDER — HEPARIN (PORCINE) IN NACL 1000-0.9 UT/500ML-% IV SOLN
INTRAVENOUS | Status: AC
  Filled 2021-05-07: qty 500

## 2021-05-07 MED ORDER — AMLODIPINE BESYLATE 10 MG PO TABS
10.0000 mg | ORAL_TABLET | Freq: Every day | ORAL | Status: DC
  Administered 2021-05-08: 10 mg via ORAL
  Filled 2021-05-07: qty 1

## 2021-05-07 MED ORDER — SODIUM CHLORIDE 0.9% FLUSH: 3.0000 mL | INTRAVENOUS | Status: DC | PRN

## 2021-05-07 MED ORDER — ACETAMINOPHEN 325 MG PO TABS: 650.0000 mg | ORAL_TABLET | ORAL | Status: DC | PRN

## 2021-05-07 MED ORDER — SODIUM CHLORIDE 0.9 % IV SOLN: INTRAVENOUS | Status: AC

## 2021-05-07 MED ORDER — MIDAZOLAM HCL 2 MG/2ML IJ SOLN
INTRAMUSCULAR | Status: AC
  Filled 2021-05-07: qty 2

## 2021-05-07 MED ORDER — FENTANYL CITRATE (PF) 100 MCG/2ML IJ SOLN
INTRAMUSCULAR | Status: DC | PRN
  Administered 2021-05-07: 25 ug via INTRAVENOUS

## 2021-05-07 MED ORDER — ESCITALOPRAM OXALATE 10 MG PO TABS: 20.0000 mg | ORAL_TABLET | Freq: Every day | ORAL | Status: DC

## 2021-05-07 MED ORDER — NITROGLYCERIN 1 MG/10 ML FOR IR/CATH LAB
INTRA_ARTERIAL | Status: AC
  Filled 2021-05-07: qty 10

## 2021-05-07 MED ORDER — ASPIRIN 81 MG PO CHEW
81.0000 mg | CHEWABLE_TABLET | Freq: Every day | ORAL | Status: DC
  Administered 2021-05-08: 81 mg via ORAL
  Filled 2021-05-07: qty 1

## 2021-05-07 MED ORDER — PANTOPRAZOLE SODIUM 40 MG PO TBEC
40.0000 mg | DELAYED_RELEASE_TABLET | Freq: Every day | ORAL | Status: DC
  Administered 2021-05-08: 40 mg via ORAL
  Filled 2021-05-07: qty 1

## 2021-05-07 MED ORDER — PNEUMOCOCCAL VAC POLYVALENT 25 MCG/0.5ML IJ INJ
0.5000 mL | INJECTION | INTRAMUSCULAR | Status: AC
  Administered 2021-05-08: 0.5 mL via INTRAMUSCULAR
  Filled 2021-05-07: qty 0.5

## 2021-05-07 MED ORDER — HYDRALAZINE HCL 20 MG/ML IJ SOLN: 10.0000 mg | Freq: Once | INTRAMUSCULAR | Status: DC

## 2021-05-07 MED ORDER — EMPAGLIFLOZIN 25 MG PO TABS
25.0000 mg | ORAL_TABLET | Freq: Every morning | ORAL | Status: DC
  Administered 2021-05-08: 25 mg via ORAL
  Filled 2021-05-07: qty 1

## 2021-05-07 MED ORDER — HYDRALAZINE HCL 20 MG/ML IJ SOLN
INTRAMUSCULAR | Status: AC
  Filled 2021-05-07: qty 1

## 2021-05-07 MED ORDER — ASPIRIN EC 81 MG PO TBEC: 81.0000 mg | DELAYED_RELEASE_TABLET | Freq: Every day | ORAL | Status: DC

## 2021-05-07 MED ORDER — FUROSEMIDE 20 MG PO TABS
20.0000 mg | ORAL_TABLET | Freq: Every day | ORAL | Status: DC
  Administered 2021-05-07 – 2021-05-08 (×2): 20 mg via ORAL
  Filled 2021-05-07 (×2): qty 1

## 2021-05-07 MED ORDER — SODIUM CHLORIDE 0.9 % IV SOLN: 250.0000 mL | INTRAVENOUS | Status: DC | PRN

## 2021-05-07 MED ORDER — SODIUM CHLORIDE 0.9 % WEIGHT BASED INFUSION
3.0000 mL/kg/h | INTRAVENOUS | Status: DC
  Administered 2021-05-07: 08:00:00 3 mL/kg/h via INTRAVENOUS

## 2021-05-07 MED ORDER — SODIUM CHLORIDE 0.9 % WEIGHT BASED INFUSION: 1.0000 mL/kg/h | INTRAVENOUS | Status: DC

## 2021-05-07 MED ORDER — CLOPIDOGREL BISULFATE 75 MG PO TABS
75.0000 mg | ORAL_TABLET | Freq: Every day | ORAL | Status: DC
  Administered 2021-05-08: 75 mg via ORAL
  Filled 2021-05-07: qty 1

## 2021-05-07 MED ORDER — SODIUM CHLORIDE 0.9% FLUSH
3.0000 mL | Freq: Two times a day (BID) | INTRAVENOUS | Status: DC
  Administered 2021-05-07: 17:00:00 3 mL via INTRAVENOUS

## 2021-05-07 MED ORDER — LABETALOL HCL 5 MG/ML IV SOLN: 10.0000 mg | INTRAVENOUS | Status: AC | PRN

## 2021-05-07 MED ORDER — METOPROLOL SUCCINATE ER 25 MG PO TB24
25.0000 mg | ORAL_TABLET | Freq: Every day | ORAL | Status: DC
  Administered 2021-05-08: 25 mg via ORAL
  Filled 2021-05-07: qty 1

## 2021-05-07 MED ORDER — LOSARTAN POTASSIUM 50 MG PO TABS
100.0000 mg | ORAL_TABLET | Freq: Every day | ORAL | Status: DC
  Administered 2021-05-08: 100 mg via ORAL
  Filled 2021-05-07: qty 2

## 2021-05-07 MED ORDER — LIDOCAINE HCL (PF) 1 % IJ SOLN
INTRAMUSCULAR | Status: AC
  Filled 2021-05-07: qty 30

## 2021-05-07 MED ORDER — HEPARIN (PORCINE) IN NACL 1000-0.9 UT/500ML-% IV SOLN
INTRAVENOUS | Status: DC | PRN
  Administered 2021-05-07 (×2): 500 mL

## 2021-05-07 MED ORDER — MORPHINE SULFATE (PF) 2 MG/ML IV SOLN: 2.0000 mg | INTRAVENOUS | Status: DC | PRN

## 2021-05-07 MED ORDER — CLOPIDOGREL BISULFATE 300 MG PO TABS
ORAL_TABLET | ORAL | Status: AC
  Filled 2021-05-07: qty 2

## 2021-05-07 MED ORDER — HEPARIN SODIUM (PORCINE) 1000 UNIT/ML IJ SOLN
INTRAMUSCULAR | Status: DC | PRN
  Administered 2021-05-07: 3000 [IU] via INTRAVENOUS
  Administered 2021-05-07: 5000 [IU] via INTRAVENOUS
  Administered 2021-05-07: 4500 [IU] via INTRAVENOUS

## 2021-05-07 MED ORDER — SODIUM CHLORIDE 0.9% FLUSH
3.0000 mL | Freq: Two times a day (BID) | INTRAVENOUS | Status: DC
  Administered 2021-05-07 – 2021-05-08 (×2): 3 mL via INTRAVENOUS

## 2021-05-07 MED ORDER — VERAPAMIL HCL 2.5 MG/ML IV SOLN
INTRAVENOUS | Status: AC
  Filled 2021-05-07: qty 2

## 2021-05-07 MED ORDER — HYDRALAZINE HCL 20 MG/ML IJ SOLN
10.0000 mg | INTRAMUSCULAR | Status: AC | PRN
  Administered 2021-05-07: 13:00:00 10 mg via INTRAVENOUS

## 2021-05-07 MED ORDER — FENTANYL CITRATE (PF) 100 MCG/2ML IJ SOLN
INTRAMUSCULAR | Status: AC
  Filled 2021-05-07: qty 2

## 2021-05-07 MED ORDER — INFLUENZA VAC SPLIT QUAD 0.5 ML IM SUSY
0.5000 mL | PREFILLED_SYRINGE | INTRAMUSCULAR | Status: AC
  Administered 2021-05-08: 0.5 mL via INTRAMUSCULAR
  Filled 2021-05-07: qty 0.5

## 2021-05-07 MED ORDER — CARVEDILOL 25 MG PO TABS
25.0000 mg | ORAL_TABLET | Freq: Two times a day (BID) | ORAL | Status: DC
  Administered 2021-05-07 – 2021-05-08 (×2): 25 mg via ORAL
  Filled 2021-05-07 (×2): qty 1

## 2021-05-07 MED ORDER — ALLOPURINOL 300 MG PO TABS: 300.0000 mg | ORAL_TABLET | ORAL | Status: DC | PRN

## 2021-05-07 MED ORDER — IOHEXOL 350 MG/ML SOLN
INTRAVENOUS | Status: DC | PRN
  Administered 2021-05-07: 12:00:00 95 mL

## 2021-05-07 MED ORDER — MIDAZOLAM HCL 2 MG/2ML IJ SOLN
INTRAMUSCULAR | Status: DC | PRN
  Administered 2021-05-07: 1 mg via INTRAVENOUS

## 2021-05-07 MED ORDER — CLOPIDOGREL BISULFATE 75 MG PO TABS: 75.0000 mg | ORAL_TABLET | Freq: Every day | ORAL | Status: DC

## 2021-05-07 MED ORDER — LIDOCAINE HCL (PF) 1 % IJ SOLN
INTRAMUSCULAR | Status: DC | PRN
  Administered 2021-05-07: 2 mL via INTRADERMAL

## 2021-05-07 MED ORDER — VERAPAMIL HCL 2.5 MG/ML IV SOLN
INTRA_ARTERIAL | Status: DC | PRN
  Administered 2021-05-07 (×2): 5 mL via INTRA_ARTERIAL

## 2021-05-07 MED ORDER — ONDANSETRON HCL 4 MG/2ML IJ SOLN: 4.0000 mg | Freq: Four times a day (QID) | INTRAMUSCULAR | Status: DC | PRN

## 2021-05-07 SURGICAL SUPPLY — 22 items
BALLN SAPPHIRE 2.0X12 (BALLOONS) ×2
BALLN ~~LOC~~ EMERGE MR 3.25X8 (BALLOONS) ×2
BALLOON SAPPHIRE 2.0X12 (BALLOONS) ×1 IMPLANT
BALLOON ~~LOC~~ EMERGE MR 3.25X8 (BALLOONS) ×1 IMPLANT
CATH INFINITI 5FR ANG PIGTAIL (CATHETERS) ×2 IMPLANT
CATH OPTITORQUE TIG 4.0 5F (CATHETERS) ×2 IMPLANT
CATH VISTA GUIDE 6FR XB3 (CATHETERS) ×2 IMPLANT
DEVICE RAD COMP TR BAND LRG (VASCULAR PRODUCTS) ×2 IMPLANT
GLIDESHEATH SLEND A-KIT 6F 22G (SHEATH) ×2 IMPLANT
GUIDEWIRE INQWIRE 1.5J.035X260 (WIRE) ×1 IMPLANT
GUIDEWIRE PRESSURE X 175 (WIRE) ×2 IMPLANT
INQWIRE 1.5J .035X260CM (WIRE) ×2
KIT ENCORE 26 ADVANTAGE (KITS) ×2 IMPLANT
KIT ESSENTIALS PG (KITS) ×2 IMPLANT
KIT HEART LEFT (KITS) ×2 IMPLANT
PACK CARDIAC CATHETERIZATION (CUSTOM PROCEDURE TRAY) ×2 IMPLANT
STENT ONYX FRONTIER 3.0X12 (Permanent Stent) ×2 IMPLANT
SYR MEDRAD MARK 7 150ML (SYRINGE) ×2 IMPLANT
TRANSDUCER W/STOPCOCK (MISCELLANEOUS) ×2 IMPLANT
TUBING CIL FLEX 10 FLL-RA (TUBING) ×2 IMPLANT
WIRE ASAHI PROWATER 180CM (WIRE) ×2 IMPLANT
WIRE HI TORQ VERSACORE-J 145CM (WIRE) ×2 IMPLANT

## 2021-05-07 NOTE — Plan of Care (Signed)

## 2021-05-07 NOTE — Interval H&P Note (Signed)
Cath Lab Visit (complete for each Cath Lab visit)  Clinical Evaluation Leading to the Procedure:   ACS: No.  Non-ACS:    Anginal Classification: CCS I  Anti-ischemic medical therapy: Maximal Therapy (2 or more classes of medications)  Non-Invasive Test Results: Intermediate-risk stress test findings: cardiac mortality 1-3%/year  Prior CABG: No previous CABG      History and Physical Interval Note:  05/07/2021 10:42 AM  Tony Allen  has presented today for surgery, with the diagnosis of abnormal stress test - angina.  The various methods of treatment have been discussed with the patient and family. After consideration of risks, benefits and other options for treatment, the patient has consented to  Procedure(s): LEFT HEART CATH AND CORONARY ANGIOGRAPHY (N/A) as a surgical intervention.  The patient's history has been reviewed, patient examined, no change in status, stable for surgery.  I have reviewed the patient's chart and labs.  Questions were answered to the patient's satisfaction.     Nanetta Batty

## 2021-05-07 NOTE — Progress Notes (Signed)
Pt presented to the [facility] with verbal abusive language when offered a sandwich  to have something to eat.   Pt was then offered crackers and peanut butter but did not say anything at this time.  Pt was told that he was in recovery waiting on a bed and he use profanity to let us know he did not want to be here.  Pt was given a diet soda to drink ,some peanut butter and crackers to eat by Luisa Hart RN who was relief for lunch.   Upon getting a bedside assignment and calling report, patient needed a Covid test .Explanation was given on why he needed the test. He cussed and stated again that he did not want to stay in the hospital.  I explain to him it was not only for his protection but for the other patients as well. When trying to swab this nose he pulled away using profanity on how he felt about swabbing his nose and then refused to allow his nose to be swabbed.  Pt was very disrespectful thru the entire post care. Pt was then transferred up to 6 east and his nurse was update.

## 2021-05-08 ENCOUNTER — Encounter (HOSPITAL_COMMUNITY): Payer: Self-pay | Admitting: Cardiovascular Disease

## 2021-05-08 ENCOUNTER — Other Ambulatory Visit: Payer: Self-pay

## 2021-05-08 ENCOUNTER — Other Ambulatory Visit (HOSPITAL_COMMUNITY): Payer: Self-pay

## 2021-05-08 DIAGNOSIS — I209 Angina pectoris, unspecified: Secondary | ICD-10-CM

## 2021-05-08 DIAGNOSIS — I25119 Atherosclerotic heart disease of native coronary artery with unspecified angina pectoris: Secondary | ICD-10-CM | POA: Diagnosis not present

## 2021-05-08 DIAGNOSIS — N189 Chronic kidney disease, unspecified: Secondary | ICD-10-CM | POA: Diagnosis not present

## 2021-05-08 DIAGNOSIS — I129 Hypertensive chronic kidney disease with stage 1 through stage 4 chronic kidney disease, or unspecified chronic kidney disease: Secondary | ICD-10-CM | POA: Diagnosis not present

## 2021-05-08 DIAGNOSIS — R9439 Abnormal result of other cardiovascular function study: Secondary | ICD-10-CM | POA: Diagnosis not present

## 2021-05-08 DIAGNOSIS — Z20822 Contact with and (suspected) exposure to covid-19: Secondary | ICD-10-CM | POA: Diagnosis not present

## 2021-05-08 LAB — BASIC METABOLIC PANEL
Anion gap: 8 (ref 5–15)
BUN: 34 mg/dL — ABNORMAL HIGH (ref 8–23)
CO2: 23 mmol/L (ref 22–32)
Calcium: 9.2 mg/dL (ref 8.9–10.3)
Chloride: 105 mmol/L (ref 98–111)
Creatinine, Ser: 1.83 mg/dL — ABNORMAL HIGH (ref 0.61–1.24)
GFR, Estimated: 41 mL/min — ABNORMAL LOW (ref >60)
Glucose, Bld: 152 mg/dL — ABNORMAL HIGH (ref 70–99)
Potassium: 4.6 mmol/L (ref 3.5–5.1)
Sodium: 136 mmol/L (ref 135–145)

## 2021-05-08 LAB — CBC
HCT: 37.2 % — ABNORMAL LOW (ref 39.0–52.0)
Hemoglobin: 12.9 g/dL — ABNORMAL LOW (ref 13.0–17.0)
MCH: 31.3 pg (ref 26.0–34.0)
MCHC: 34.7 g/dL (ref 30.0–36.0)
MCV: 90.3 fL (ref 80.0–100.0)
Platelets: 214 10*3/uL (ref 150–400)
RBC: 4.12 MIL/uL — ABNORMAL LOW (ref 4.22–5.81)
RDW: 13.5 % (ref 11.5–15.5)
WBC: 9.5 10*3/uL (ref 4.0–10.5)
nRBC: 0 % (ref 0.0–0.2)

## 2021-05-08 LAB — GLUCOSE, CAPILLARY: Glucose-Capillary: 146 mg/dL — ABNORMAL HIGH (ref 70–99)

## 2021-05-08 MED ORDER — ROSUVASTATIN CALCIUM 20 MG PO TABS: 20.0000 mg | ORAL_TABLET | Freq: Every day | ORAL | 1 refills | Status: DC

## 2021-05-08 MED ORDER — PANTOPRAZOLE SODIUM 40 MG PO TBEC: 40.0000 mg | DELAYED_RELEASE_TABLET | Freq: Every day | ORAL | 2 refills | Status: DC

## 2021-05-08 NOTE — Progress Notes (Signed)
CARDIAC REHAB PHASE I   PRE:  Rate/Rhythm: 84 SR    BP: sitting 161/93    SaO2:   MODE:  Ambulation: 400 ft   POST:  Rate/Rhythm: 89 SR with PACs    BP: sitting 142/90     SaO2: 97 RA  Pt ambulated slowly in hall. Had to stop to rest for back/hips and calf pain. Sts this is normal. He sts he does not ambulate far. Pt also sts that he never had angina and that he was seeing Revankar for falling and balance. BP elevated.  Discussed with pt and wife restrictions, need for Plavix, NTG and role of diet and exercise. He was not interested in diet or exercise change or information. Will refer to [place] CRPII. He sts he takes his meds. [id number]   Harriet Masson CES, ACSM 05/08/2021 9:06 AM

## 2021-05-08 NOTE — TOC Benefit Eligibility Note (Signed)
Patient Product/process development scientist completed.    The patient is currently admitted and upon discharge could be taking Entrsto 24-26mg .  This med. requires a prior authorization.  The patient is insured through [employer]     [place] Pink, CPhT Pharmacy Patient Advocate Specialist [facility] Patient Advocate Team Direct Number: [phone]  Fax: [phone]

## 2021-05-08 NOTE — Discharge Summary (Signed)
Discharge Summary    Patient ID: Tony Allen MRN: [id number]; DOB: July 25, 1955  Admit date: 05/07/2021 Discharge date: 05/08/2021  PCP:  Suzan Garibaldi, [facility] Cardiologist:  Jenean Lindau, MD     Discharge Diagnoses    Principal Problem:   CAD (coronary artery disease) Active Problems:   Hypertension   Dyslipidemia   Angina pectoris ([place])   Abnormal nuclear stress test  Diagnostic Studies/Procedures    Cath: 05/07/21  Ost RCA to Prox RCA lesion is 100% stenosed.   Ost Cx to Prox Cx lesion is 75% stenosed.   Ost LM to Mid LM lesion is 30% stenosed.   A drug-eluting stent was successfully placed.   Post intervention, there is a 0% residual stenosis.  IMPRESSION: Mr. Allen has an occluded proximal dominant RCA with left-to-right collaterals.  This most likely is responsible for his inferior ischemic abnormality on Myoview stress testing.  He also had a 75% "in-stent restenosis" within the proximal circumflex stent which I successfully restented after determining physiologic significance using DFR (0.87).  Patient will need to be on DAPT uninterrupted for at least 12 months.  He will be gently hydrated overnight given his CKD and discharged home in the morning.  Dr. Geraldo Pitter was notified of these results.   Quay Burow. MD, [facility] 05/07/2021 11:53 AM  Diagnostic Dominance: Right Intervention   _____________   History of Present Illness     Tony Allen is a 65 y.o. male with past medical history of CAD status post remote stenting to circumflex, hypertension, hyperlipidemia, diabetes and obesity.  He was recently seen in the office with complaints of chest tightness on exertion.  He had a stress test done which was abnormal showing mild inferior peri-infarction ischemia.  Given this finding outpatient cardiac catheterization was recommended for definitive evaluation.  Hospital Course     Underwent cardiac catheterization noted  above with Dr. Gwenlyn Found with occluded proximal dominant RCA with left-to-right collaterals, 75% in-stent restenosis within proximal circumflex stent which was treated with PCI/DES x1.  Recommendations to treat with DAPT with aspirin/plavix for at least 1 year.  No complications noted overnight.  Able to ambulate with cardiac rehab.  Prilosec was switched to Protonix prior to discharge.  Patient was agreeable to transition from pravastatin 40 mg daily to Crestor 20 mg daily.  Advised to keep a log of his blood pressures as an outpatient.  He reports readings are normally in the 403-474 range systolic.  General: Well developed, well nourished, male appearing in no acute distress. Head: Normocephalic, atraumatic.  Neck: Supple without bruits, JVD. Lungs:  Resp regular and unlabored, CTA. Heart: RRR, S1, S2, no S3, S4, or murmur; no rub. Abdomen: Soft, non-tender, non-distended with normoactive bowel sounds. No hepatomegaly. No rebound/guarding. No obvious abdominal masses. Extremities: No clubbing, cyanosis, edema. Distal pedal pulses are 2+ bilaterally.  Right radial cath site stable without bruising or hematoma Neuro: Alert and oriented X 3. Moves all extremities spontaneously. Psych: Normal affect.  Patient was seen by Dr. Martinique and deemed stable for discharge home. Follow up in the office has been arranged. Medications sent to patient's pharmacy of choice. Educated by PharmD prior to discharge   Did the patient have an acute coronary syndrome (MI, NSTEMI, STEMI, etc) this admission?:  No                               Did the  patient have a percutaneous coronary intervention (stent / angioplasty)?:  Yes.     Cath/PCI Registry Performance & Quality Measures: Aspirin prescribed? - Yes ADP Receptor Inhibitor (Plavix/Clopidogrel, Brilinta/Ticagrelor or Effient/Prasugrel) prescribed (includes medically managed patients)? - Yes High Intensity Statin (Lipitor 40-26m or Crestor 20-472m prescribed? -  Yes For EF <40%, was ACEI/ARB prescribed? - Not Applicable (EF >/= 4033%For EF <40%, Aldosterone Antagonist (Spironolactone or Eplerenone) prescribed? - Not Applicable (EF >/= 4082%Cardiac Rehab Phase II ordered? - Yes    The patient will be scheduled for a TOC follow up appointment in 10-14 days.  A message has been sent to the [facility]uling Pool at the office where the patient should be seen for follow up.  _____________  Discharge Vitals Blood pressure (!) 150/81, pulse 83, temperature 97.6 F (36.4 C), temperature source Oral, resp. rate 16, height '5\' 6"'  (1.676 m), weight 92.5 kg, SpO2 96 %.  Filed Weights   05/07/21 0712  Weight: 92.5 kg    Labs & Radiologic Studies    CBC Recent Labs    05/08/21 0152  WBC 9.5  HGB 12.9*  HCT 37.2*  MCV 90.3  PLT 21505 Basic Metabolic Panel Recent Labs    05/08/21 0152  NA 136  K 4.6  CL 105  CO2 23  GLUCOSE 152*  BUN 34*  CREATININE 1.83*  CALCIUM 9.2   Liver Function Tests No results for input(s): AST, ALT, ALKPHOS, BILITOT, PROT, ALBUMIN in the last 72 hours. No results for input(s): LIPASE, AMYLASE in the last 72 hours. High Sensitivity Troponin:   No results for input(s): TROPONINIHS in the last 720 hours.  BNP Invalid input(s): POCBNP D-Dimer No results for input(s): DDIMER in the last 72 hours. Hemoglobin A1C No results for input(s): HGBA1C in the last 72 hours. Fasting Lipid Panel No results for input(s): CHOL, HDL, LDLCALC, TRIG, CHOLHDL, LDLDIRECT in the last 72 hours. Thyroid Function Tests No results for input(s): TSH, T4TOTAL, T3FREE, THYROIDAB in the last 72 hours.  Invalid input(s): FREET3 _____________  CARDIAC CATHETERIZATION  Result Date: 05/07/2021 Images from the original result were not included.   Ost RCA to Prox RCA lesion is 100% stenosed.   Ost Cx to Prox Cx lesion is 75% stenosed.   Ost LM to Mid LM lesion is 30% stenosed.   A drug-eluting stent was successfully placed.   Post  intervention, there is a 0% residual stenosis. [name] a [age].o. male  [id number]:  [facility]: JoQuay BurowM.D. 5/23-Feb-1957ATE OF PROCEDURE:  05/07/2021 DATE OF DISCHARGE: CARDIAC CATHETERIZATION / PCI DES LCX History obtained from chart review.  Mr. JoMartiniques a [age]ear old married Caucasian male patient of Dr. ReJulien Nordmann He has a history of remote circumflex stenting in [place] approxi-5 years ago.  He also has history of discontinue tobacco abuse, treated hypertension, hyperlipidemia and diabetes.  Because of some chest pain and shortness of breath a 2D echo was performed that was essentially normal and a Myoview stress test showed inferior ischemia.  He does have chronic renal insufficiency with serum creatinine in the low to mid 2 range.  He was admitted early for rehydration and presents now for diagnostic coronary angiography. PROCEDURE DESCRIPTION: The patient was brought to the second floor [facility] cath lab in the postabsorptive state. He was premedicated with IV Versed and fentanyl. His right wrist was prepped and shaved in usual sterile fashion. Xylocaine 1% was used for  local anesthesia. A 6 French sheath was inserted into the right radial artery using standard Seldinger technique. The patient received 4500 units  of heparin intravenously.  A 5 [place] TIG catheter and pigtail catheter were used for selective coronary angiography and obtain left heart pressures.  Isovue dye is used for the entirety of the case (95 cc contrast total to patient).  Retrograde aortic, left ventricular and pullback pressures were recorded.  Radial cocktail was administered via the SideArm sheath. The patient received a total of 12,500's of heparin with ACT of 289.  The RCA was totaled proximally with left-to-right collaterals.  The proximal circumflex stent had a 75% "in-stent restenosis.  I decided to do DFR to assess physiologic significance.  The patient was already on  aspirin and Plavix. Using a 6 [place] XB 3 guide catheter along with a Abbott DFR wire was able to cross the proximal circumflex once ACT was documented to be therapeutic.  The DFR was 0.87 suggesting that this was physiologically significant.  I then switched out for a 014 [place] and predilated the proximal area of "in-stent restenosis" with a 2 mm x 12 mm balloon.  I then placed a 3 mm x 12 mm long Medtronic frontier drug-eluting stent and deployed at 14 to 16 atm.  There was still a "small waist".  I postdilated this area with a 3.25 mm x 8 mm long noncompliant balloon at 16 atm (3.3 mm) resulting reduction of 75% fairly focal area of in-stent restenosis to 0% residual.  The patient tolerated the procedure well.  The guidewire and catheter were removed.  The sheath was removed and a TR band was placed on the right wrist to achieve patent hemostasis.  The patient left lab in stable condition.   Mr. Allen has an occluded proximal dominant RCA with left-to-right collaterals.  This most likely is responsible for his inferior ischemic abnormality on Myoview stress testing.  He also had a 75% "in-stent restenosis" within the proximal circumflex stent which I successfully restented after determining physiologic significance using DFR (0.87).  Patient will need to be on DAPT uninterrupted for at least 12 months.  He will be gently hydrated overnight given his CKD and discharged home in the morning.  Dr. Geraldo Pitter was notified of these results. Quay Burow. MD, [facility] 05/07/2021 11:53 AM    MYOCARDIAL PERFUSION IMAGING  Result Date: 04/30/2021   Findings are consistent with mild inferior peri infarction ischemia. Fixed inferior defect.The study is intermediate risk.   Left ventricular function is normal. Nuclear stress EF: 54 %. The left ventricular ejection fraction is mildly decreased (45-54%). End diastolic cavity size is normal. Inferior segment hypokinesis.   Prior study not available for comparison.    ECHOCARDIOGRAM COMPLETE  Result Date: 04/22/2021    ECHOCARDIOGRAM REPORT   Patient Name:   Tony Allen Date of Exam: 04/22/2021 Medical Rec #:  [id number]         Height:       66.0 in Accession #:    [id number]        Weight:       211.0 lb Date of Birth:  11-07-1955         BSA:          2.046 m Patient Age:    24 years          BP:           136/70 mmHg Patient Gender: M  HR:           65 bpm. Exam Location:  [place] Procedure: 2D Echo, Cardiac Doppler, Color Doppler, Strain Analysis and            Intracardiac Opacification Agent Indications:    Chest Pain R07.9CAD Native Vessel I25.10  History:        Patient has prior history of Echocardiogram examinations.                 Signs/Symptoms:Syncope; Risk Factors:Hypertension, Dyslipidemia                 and Diabetes.  Sonographer:    Luane School RDCS Referring Phys: Waverly [facility]  Sonographer Comments: Suboptimal apical window. IMPRESSIONS  1. Left ventricular ejection fraction, by estimation, is 55 to 60%. The left ventricle has normal function. The left ventricle has no regional wall motion abnormalities. There is moderate left ventricular hypertrophy. Left ventricular diastolic parameters are consistent with Grade I diastolic dysfunction (impaired relaxation).  2. Left atrial size was moderately dilated.  3. aorta, measuring 39 mm. FINDINGS  Left Ventricle: Left ventricular ejection fraction, by estimation, is 55 to 60%. The left ventricle has normal function. The left ventricle has no regional wall motion abnormalities. Definity contrast agent was given IV to delineate the left ventricular  endocardial borders. The left ventricular internal cavity size was normal in size. There is moderate left ventricular hypertrophy. Left ventricular diastolic parameters are consistent with Grade I diastolic dysfunction (impaired relaxation). Right Ventricle: The right ventricular size is normal. No increase in right ventricular wall  thickness. Right ventricular systolic function is normal. There is normal pulmonary artery systolic pressure. The tricuspid regurgitant velocity is 0.82 m/s, and  with an assumed right atrial pressure of 3 mmHg, the estimated right ventricular systolic pressure is 5.7 mmHg. Left Atrium: Left atrial size was moderately dilated. Right Atrium: Right atrial size was normal in size. Pericardium: There is no evidence of pericardial effusion. Mitral Valve: The mitral valve is normal in structure. No evidence of mitral valve regurgitation. No evidence of mitral valve stenosis. Tricuspid Valve: The tricuspid valve is normal in structure. Tricuspid valve regurgitation is not demonstrated. No evidence of tricuspid stenosis. Aortic Valve: The aortic valve is normal in structure. Aortic valve regurgitation is not visualized. No aortic stenosis is present. Pulmonic Valve: The pulmonic valve was normal in structure. Pulmonic valve regurgitation is not visualized. No evidence of pulmonic stenosis. Aorta: The aortic root is normal in size and structure. 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 Venous: The inferior vena cava is normal in size with greater than 50% respiratory variability, suggesting right atrial pressure of 3 mmHg. IAS/Shunts: No atrial level shunt detected by color flow Doppler.  LEFT VENTRICLE PLAX 2D LVIDd:         4.70 cm   Diastology LV PW:         1.40 cm   LV e' medial:    3.94 cm/s LV IVS:        1.40 cm   LV E/e' medial:  22.5 LVOT diam:     2.10 cm   LV e' lateral:   4.11 cm/s LV SV:         59        LV E/e' lateral: 21.6 LV SV Index:   29 LVOT Area:     3.46 cm  RIGHT VENTRICLE            IVC  RV S prime:     9.24 cm/s  IVC diam: 1.50 cm TAPSE (M-mode): 1.9 cm LEFT ATRIUM             Index        RIGHT ATRIUM           Index LA Vol (A2C):   50.3 ml 24.59 ml/m  RA Area:     11.80 cm LA Vol (A4C):   63.9 ml 31.23 ml/m  RA Volume:   25.10 ml  12.27 ml/m LA Biplane Vol: 59.8 ml  29.23 ml/m  AORTIC VALVE LVOT Vmax:   81.70 cm/s LVOT Vmean:  53.200 cm/s LVOT VTI:    0.170 m  AORTA Ao Root diam: 3.60 cm Ao Asc diam:  3.83 cm Ao Desc diam: 2.40 cm MITRAL VALVE                TRICUSPID VALVE MV Area (PHT): 3.36 cm     TR Peak grad:   2.7 mmHg MV Decel Time: 226 msec     TR Vmax:        82.00 cm/s MV E velocity: 88.60 cm/s MV A velocity: 117.00 cm/s  SHUNTS MV E/A ratio:  0.76         Systemic VTI:  0.17 m                             Systemic Diam: 2.10 cm Jyl Heinz MD Electronically signed by Jyl Heinz MD Signature Date/Time: 04/22/2021/12:25:32 PM    Final    Disposition   Pt is being discharged home today in good condition.  Follow-up Plans & Appointments     Follow-up Information     Revankar, Reita Cliche, MD Follow up on 05/29/2021.   Specialty: Cardiology Why: at 3pm for your follow up appt Contact information: [place] [address] [phone]                Discharge Instructions     Amb Referral to Cardiac Rehabilitation   Complete by: As directed    To [place]   Diagnosis:  PTCA Coronary Stents     After initial evaluation and assessments completed: Virtual Based Care may be provided alone or in conjunction with Phase 2 Cardiac Rehab based on patient barriers.: Yes   Call MD for:  difficulty breathing, headache or visual disturbances   Complete by: As directed    Call MD for:  persistant dizziness or light-headedness   Complete by: As directed    Call MD for:  redness, tenderness, or signs of infection (pain, swelling, redness, odor or green/yellow discharge around incision site)   Complete by: As directed    Diet - low sodium heart healthy   Complete by: As directed    Discharge instructions   Complete by: As directed    Radial Site Care Refer to this sheet in the next few weeks. These instructions provide you with information on caring for yourself after your procedure. Your caregiver may also give you more specific  instructions. Your treatment has been planned according to current medical practices, but problems sometimes occur. Call your caregiver if you have any problems or questions after your procedure. HOME CARE INSTRUCTIONS You may shower the day after the procedure. Remove the bandage (dressing) and gently wash the site with plain soap and water. Gently pat the site dry.  Do not apply powder or lotion to the site.  Do not submerge the affected site in water for 3 to 5 days.  Inspect the site at least twice daily.  Do not flex or bend the affected arm for 24 hours.  No lifting over 5 pounds (2.3 kg) for 5 days after your procedure.  Do not drive home if you are discharged the same day of the procedure. Have someone else drive you.  You may drive 24 hours after the procedure unless otherwise instructed by your caregiver.  What to expect: Any bruising will usually fade within 1 to 2 weeks.  Blood that collects in the tissue (hematoma) may be painful to the touch. It should usually decrease in size and tenderness within 1 to 2 weeks.  SEEK IMMEDIATE MEDICAL CARE IF: You have unusual pain at the radial site.  You have redness, warmth, swelling, or pain at the radial site.  You have drainage (other than a small amount of blood on the dressing).  You have chills.  You have a fever or persistent symptoms for more than 72 hours.  You have a fever and your symptoms suddenly get worse.  Your arm becomes pale, cool, tingly, or numb.  You have heavy bleeding from the site. Hold pressure on the site.   PLEASE DO NOT MISS ANY DOSES OF YOUR PLAVIX!!!!! Also keep a log of you blood pressures and bring back to your follow up appt. Please call the office with any questions.   Patients taking blood thinners should generally stay away from medicines like ibuprofen, Advil, Motrin, naproxen, and Aleve due to risk of stomach bleeding. You may take Tylenol as directed or talk to your primary doctor about  alternatives.  Some studies suggest Prilosec/Omeprazole interacts with Plavix. We changed your Prilosec/Omeprazole to the equivalent dose of Protonix for less chance of interaction.  PLEASE ENSURE THAT YOU DO NOT RUN OUT OF YOUR PLAVIX. This medication is very important to remain on for at least one year. IF you have issues obtaining this medication due to cost please CALL the office 3-5 business days prior to running out in order to prevent missing doses of this medication.   Increase activity slowly   Complete by: As directed        Discharge Medications   Allergies as of 05/08/2021   No Known Allergies      Medication List     STOP taking these medications    carvedilol 25 MG tablet Commonly known as: COREG   omeprazole 40 MG capsule Commonly known as: PRILOSEC Replaced by: pantoprazole 40 MG tablet   pravastatin 40 MG tablet Commonly known as: PRAVACHOL       TAKE these medications    allopurinol 300 MG tablet Commonly known as: ZYLOPRIM Take 300 mg by mouth as needed (gout).   amLODipine 10 MG tablet Commonly known as: NORVASC Take 10 mg by mouth daily.   aspirin EC 81 MG tablet Take 1 tablet (81 mg total) by mouth daily. Swallow whole.   celecoxib 200 MG capsule Commonly known as: CELEBREX Take 200 mg by mouth 2 (two) times daily.   clopidogrel 75 MG tablet Commonly known as: PLAVIX Take 1 tablet (75 mg total) by mouth daily.   escitalopram 20 MG tablet Commonly known as: LEXAPRO Take 20 mg by mouth daily.   furosemide 20 MG tablet Commonly known as: LASIX Take 20 mg by mouth daily.   gabapentin 300 MG capsule Commonly known as: NEURONTIN Take 300 mg by mouth 2 (two) times daily.  Jardiance 25 MG Tabs tablet Generic drug: empagliflozin Take 25 mg by mouth every morning.   losartan 100 MG tablet Commonly known as: COZAAR Take 100 mg by mouth daily.   metoprolol succinate 25 MG 24 hr tablet Commonly known as: TOPROL-XL Take 25 mg by  mouth daily.   nitroGLYCERIN 0.4 MG SL tablet Commonly known as: NITROSTAT Place 0.4 mg under the tongue every 5 (five) minutes as needed for chest pain.   pantoprazole 40 MG tablet Commonly known as: PROTONIX Take 1 tablet (40 mg total) by mouth daily. Replaces: omeprazole 40 MG capsule   rosuvastatin 20 MG tablet Commonly known as: Crestor Take 1 tablet (20 mg total) by mouth daily.   tamsulosin 0.4 MG Caps capsule Commonly known as: FLOMAX Take 0.4 mg by mouth daily.   Trulicity 3 BM/1.5AE Sopn Generic drug: Dulaglutide Inject 3 mg into the skin once a week.        Outstanding Labs/Studies   FLP/LFTs in 8 weeks   Duration of Discharge Encounter   Greater than 30 minutes including physician time.  Signed, Reino Bellis, NP 05/08/2021, 11:06 AM

## 2021-05-11 ENCOUNTER — Telehealth (HOSPITAL_COMMUNITY): Payer: Self-pay

## 2021-05-11 NOTE — Telephone Encounter (Signed)
Per phase I cardiac rehab, fax cardiac rehab referral to [facility]. °

## 2021-05-18 ENCOUNTER — Ambulatory Visit (HOSPITAL_BASED_OUTPATIENT_CLINIC_OR_DEPARTMENT_OTHER)
Admission: RE | Admit: 2021-05-18 | Discharge: 2021-05-18 | Disposition: A | Discharge status: Home / Self Care | Payer: Medicaid Other | Source: Ambulatory Visit | Attending: Cardiology | Admitting: Cardiology

## 2021-05-18 ENCOUNTER — Other Ambulatory Visit: Payer: Self-pay

## 2021-05-18 DIAGNOSIS — I77810 Thoracic aortic ectasia: Secondary | ICD-10-CM | POA: Insufficient documentation

## 2021-05-19 NOTE — Addendum Note (Signed)
Addended by: Eleonore Chiquito on: 05/19/2021 12:48 PM   Modules accepted: Orders

## 2021-05-27 LAB — BASIC METABOLIC PANEL
BUN/Creatinine Ratio: 17 (ref 10–24)
BUN: 29 mg/dL — ABNORMAL HIGH (ref 8–27)
CO2: 20 mmol/L (ref 20–29)
Calcium: 9.7 mg/dL (ref 8.6–10.2)
Chloride: 103 mmol/L (ref 96–106)
Creatinine, Ser: 1.72 mg/dL — ABNORMAL HIGH (ref 0.76–1.27)
Glucose: 216 mg/dL — ABNORMAL HIGH (ref 70–99)
Potassium: 4.4 mmol/L (ref 3.5–5.2)
Sodium: 138 mmol/L (ref 134–144)
eGFR: 44 mL/min/{1.73_m2} — ABNORMAL LOW (ref >59)

## 2021-05-27 LAB — HEPATIC FUNCTION PANEL
ALT: 31 IU/L (ref 0–44)
AST: 20 IU/L (ref 0–40)
Albumin: 4 g/dL (ref 3.8–4.8)
Alkaline Phosphatase: 112 IU/L (ref 44–121)
Bilirubin Total: 0.2 mg/dL (ref 0.0–1.2)
Bilirubin, Direct: <0.1 mg/dL (ref 0.00–0.40)
Total Protein: 6.8 g/dL (ref 6.0–8.5)

## 2021-05-27 LAB — LIPID PANEL
Chol/HDL Ratio: 4.9 ratio (ref 0.0–5.0)
Cholesterol, Total: 158 mg/dL (ref 100–199)
HDL: 32 mg/dL — ABNORMAL LOW (ref >39)
LDL Chol Calc (NIH): 49 mg/dL (ref 0–99)
Triglycerides: 526 mg/dL — ABNORMAL HIGH (ref 0–149)
VLDL Cholesterol Cal: 77 mg/dL — ABNORMAL HIGH (ref 5–40)

## 2021-05-28 MED ORDER — FISH OIL 1000 MG PO CAPS: 2.0000 | ORAL_CAPSULE | Freq: Two times a day (BID) | ORAL | 12 refills | Status: DC

## 2021-05-28 MED ORDER — FENOFIBRATE 145 MG PO TABS: 145.0000 mg | ORAL_TABLET | Freq: Every day | ORAL | 3 refills | Status: DC

## 2021-05-28 NOTE — Addendum Note (Signed)
Addended by: Eleonore Chiquito on: 05/28/2021 11:45 AM   Modules accepted: Orders

## 2021-05-29 ENCOUNTER — Other Ambulatory Visit: Payer: Self-pay

## 2021-05-29 ENCOUNTER — Ambulatory Visit (INDEPENDENT_AMBULATORY_CARE_PROVIDER_SITE_OTHER): Payer: Medicaid Other | Admitting: Cardiology

## 2021-05-29 ENCOUNTER — Encounter: Payer: Self-pay | Admitting: Cardiology

## 2021-05-29 VITALS — BP 148/88 | HR 78 | Ht 65.0 in | Wt 211.6 lb

## 2021-05-29 DIAGNOSIS — I10 Essential (primary) hypertension: Secondary | ICD-10-CM

## 2021-05-29 DIAGNOSIS — E119 Type 2 diabetes mellitus without complications: Secondary | ICD-10-CM | POA: Diagnosis not present

## 2021-05-29 DIAGNOSIS — I2510 Atherosclerotic heart disease of native coronary artery without angina pectoris: Secondary | ICD-10-CM | POA: Diagnosis not present

## 2021-05-29 DIAGNOSIS — E782 Mixed hyperlipidemia: Secondary | ICD-10-CM

## 2021-05-29 NOTE — Patient Instructions (Addendum)
Medication Instructions:  Your physician has recommended you make the following change in your medication:   Stop Rosuvastatin  Continue Pravastatin 40 mg daily.  Start Fenofibrate 145 mg daily.  Start fish oil 2000 mg two times daily.  *If you need a refill on your cardiac medications before your next appointment, please call your pharmacy*   Lab Work: Your physician recommends that you return for lab work in: 6 weeks You need to have labs done when you are fasting.  You can come Monday through Friday 8:30 am to 12:00 pm and 1:15 to 4:30. You do not need to make an appointment as the order has already been placed. The labs you are going to have done are LFT and Lipids.  If you have labs (blood work) drawn today and your tests are completely normal, you will receive your results only by: MyChart Message (if you have MyChart) OR A paper copy in the mail If you have any lab test that is abnormal or we need to change your treatment, we will call you to review the results.   Testing/Procedures: None ordered   Follow-Up: At [facility], you and your health needs are our priority.  As part of our continuing mission to provide you with exceptional heart care, we have created designated Provider Care Teams.  These Care Teams include your primary Cardiologist (physician) and Advanced Practice Providers (APPs -  Physician Assistants and Nurse Practitioners) who all work together to provide you with the care you need, when you need it.  We recommend signing up for the patient portal called "MyChart".  Sign up information is provided on this After Visit Summary.  MyChart is used to connect with patients for Virtual Visits (Telemedicine).  Patients are able to view lab/test results, encounter notes, upcoming appointments, etc.  Non-urgent messages can be sent to your provider as well.   To learn more about what you can do with MyChart, go to [email or web address].    Your next appointment:    3 month(s)  The format for your next appointment:   In Person  Provider:   Belva Crome, MD   Other Instructions  Diabetes Mellitus and Nutrition, Adult When you have diabetes, or diabetes mellitus, it is very important to have healthy eating habits because your blood sugar (glucose) levels are greatly affected by what you eat and drink. Eating healthy foods in the right amounts, at about the same times every day, can help you: Manage your blood glucose. Lower your risk of heart disease. Improve your blood pressure. Reach or maintain a healthy weight. What can affect my meal plan? Every person with diabetes is different, and each person has different needs for a meal plan. Your health care provider may recommend that you work with a dietitian to make a meal plan that is best for you. Your meal plan may vary depending on factors such as: The calories you need. The medicines you take. Your weight. Your blood glucose, blood pressure, and cholesterol levels. Your activity level. Other health conditions you have, such as heart or kidney disease. How do carbohydrates affect me? Carbohydrates, also called carbs, affect your blood glucose level more than any other type of food. Eating carbs raises the amount of glucose in your blood. It is important to know how many carbs you can safely have in each meal. This is different for every person. Your dietitian can help you calculate how many carbs you should have at each meal and for each snack.  How does alcohol affect me? Alcohol can cause a decrease in blood glucose (hypoglycemia), especially if you use insulin or take certain diabetes medicines by mouth. Hypoglycemia can be a life-threatening condition. Symptoms of hypoglycemia, such as sleepiness, dizziness, and confusion, are similar to symptoms of having too much alcohol. Do not drink alcohol if: Your health care provider tells you not to drink. You are pregnant, may be pregnant, or are  planning to become pregnant. If you drink alcohol: Limit how much you have to: 0-1 drink a day for women. 0-2 drinks a day for men. Know how much alcohol is in your drink. In the [place] one drink equals one 12 oz bottle of beer (355 mL), one 5 oz glass of wine (148 mL), or one 1 oz glass of hard liquor (44 mL). Keep yourself hydrated with water, diet soda, or unsweetened iced tea. Keep in mind that regular soda, juice, and other mixers may contain a lot of sugar and must be counted as carbs. What are tips for following this plan? Reading food labels Start by checking the serving size on the Nutrition Facts label of packaged foods and drinks. The number of calories and the amount of carbs, fats, and other nutrients listed on the label are based on one serving of the item. Many items contain more than one serving per package. Check the total grams (g) of carbs in one serving. Check the number of grams of saturated fats and trans fats in one serving. Choose foods that have a low amount or none of these fats. Check the number of milligrams (mg) of salt (sodium) in one serving. Most people should limit total sodium intake to less than 2,300 mg per day. Always check the nutrition information of foods labeled as "low-fat" or "nonfat." These foods may be higher in added sugar or refined carbs and should be avoided. Talk to your dietitian to identify your daily goals for nutrients listed on the label. Shopping Avoid buying canned, pre-made, or processed foods. These foods tend to be high in fat, sodium, and added sugar. Shop around the outside edge of the grocery store. This is where you will most often find fresh fruits and vegetables, bulk grains, fresh meats, and fresh dairy products. Cooking Use low-heat cooking methods, such as baking, instead of high-heat cooking methods, such as deep frying. Cook using healthy oils, such as olive, canola, or sunflower oil. Avoid cooking with butter, cream, or  high-fat meats. Meal planning Eat meals and snacks regularly, preferably at the same times every day. Avoid going long periods of time without eating. Eat foods that are high in fiber, such as fresh fruits, vegetables, beans, and whole grains. Eat 4-6 oz (112-168 g) of lean protein each day, such as lean meat, chicken, fish, eggs, or tofu. One ounce (oz) (28 g) of lean protein is equal to: 1 oz (28 g) of meat, chicken, or fish. 1 egg.  cup (62 g) of tofu. Eat some foods each day that contain healthy fats, such as avocado, nuts, seeds, and fish. What foods should I eat? Fruits Berries. Apples. Oranges. Peaches. Apricots. Plums. Grapes. Mangoes. Papayas. Pomegranates. Kiwi. Cherries. Vegetables Leafy greens, including lettuce, spinach, kale, chard, collard greens, mustard greens, and cabbage. Beets. Cauliflower. Broccoli. Carrots. Green beans. Tomatoes. Peppers. Onions. Cucumbers. Brussels sprouts. Grains Whole grains, such as whole-wheat or whole-grain bread, crackers, tortillas, cereal, and pasta. Unsweetened oatmeal. Quinoa. Brown or wild rice. Meats and other proteins Seafood. Poultry without skin. Lean cuts of poultry  and beef. Tofu. Nuts. Seeds. Dairy Low-fat or fat-free dairy products such as milk, yogurt, and cheese. The items listed above may not be a complete list of foods and beverages you can eat and drink. Contact a dietitian for more information. What foods should I avoid? Fruits Fruits canned with syrup. Vegetables Canned vegetables. Frozen vegetables with butter or cream sauce. Grains Refined white flour and flour products such as bread, pasta, snack foods, and cereals. Avoid all processed foods. Meats and other proteins Fatty cuts of meat. Poultry with skin. Breaded or fried meats. Processed meat. Avoid saturated fats. Dairy Full-fat yogurt, cheese, or milk. Beverages Sweetened drinks, such as soda or iced tea. The items listed above may not be a complete list of  foods and beverages you should avoid. Contact a dietitian for more information. Questions to ask a health care provider Do I need to meet with a certified diabetes care and education specialist? Do I need to meet with a dietitian? What number can I call if I have questions? When are the best times to check my blood glucose? Where to find more information: American Diabetes Association: diabetes.org Academy of Nutrition and Dietetics: eatright.[employer] of Diabetes and Digestive and Kidney Diseases: [email or web address].si Association of Diabetes Care & Education Specialists: diabeteseducator.org Summary It is important to have healthy eating habits because your blood sugar (glucose) levels are greatly affected by what you eat and drink. It is important to use alcohol carefully. A healthy meal plan will help you manage your blood glucose and lower your risk of heart disease. Your health care provider may recommend that you work with a dietitian to make a meal plan that is best for you. This information is not intended to replace advice given to you by your health care provider. Make sure you discuss any questions you have with your health care provider. Document Revised: 01/09/2020 Document Reviewed: 01/09/2020 Elsevier Patient Education  2022 Elsevier Inc.  Fenofibrate Tablets What is this medication? FENOFIBRATE (fen oh FYE brate) treats high cholesterol and triglyceride levels in your blood. It works by decreasing bad cholesterol and fats (such as LDL, triglycerides) and increasing good cholesterol (HDL) in your blood. It belongs to a group of medications called fibrates. Changes to diet and exercise are often combined with this medication. This medicine may be used for other purposes; ask your health care provider or pharmacist if you have questions. COMMON BRAND NAME(S): Fenoglide, Blenda Nicely, Triglide What should I tell my care team before I take this medication? They need to  know if you have any of these conditions: Blood clots Gallbladder disease High blood sugar (diabetes) Kidney disease Liver disease Low thyroid levels Take medications that treat or prevent blood clots An unusual or allergic reaction to fenofibrate, other medications, foods, dyes, or preservatives Pregnant or trying to get pregnant Breast-feeding How should I use this medication? Take this medication by mouth. Take it as directed on the prescription label at the same time every day. Do not cut, crush or chew this medication. Swallow the tablets whole. Keep taking it unless your care team tells you to stop. Some brands of this medication may be taken with or without food. If it upsets your stomach, take it with food. Other brands of this medication should be taken with food. Ask your care team or pharmacist if you should take this medication with or without food. Take bile acid sequestrants at a different time of day than this medication. Take this medication 1 hour  BEFORE or 4 to 6 hours AFTER bile acid sequestrants. Talk to your care team regarding the use of this medication in children. Special care may be needed. Overdosage: If you think you have taken too much of this medicine contact a poison control center or emergency room at once. NOTE: This medicine is only for you. Do not share this medicine with others. What if I miss a dose? If you miss a dose, take it as soon as you can. If it is almost time for your next dose, take only that dose. Do not take double or extra doses. What may interact with this medication? This medication may interact with the following: Bile acid sequestrants like cholestyramine, colesevelam, and colestipol Certain medications for cholesterol like atorvastatin, lovastatin, and simvastatin Certain medications for diabetes, like glipizide or glyburide Certain medications that suppress the body's immune response like cyclosporine and  tacrolimus Colchicine Ezetimibe Supplements like red yeast rice Warfarin This list may not describe all possible interactions. Give your health care provider a list of all the medicines, herbs, non-prescription drugs, or dietary supplements you use. Also tell them if you smoke, drink alcohol, or use illegal drugs. Some items may interact with your medicine. What should I watch for while using this medication? Visit your care team for regular checks on your progress. Tell your care team if your symptoms do not start to get better or if they get worse. This medication may cause serious skin reactions. They can happen weeks to months after starting the medication. Contact your care team right away if you notice fevers or flu-like symptoms with a rash. The rash may be red or purple and then turn into blisters or peeling of the skin. Or, you might notice a red rash with swelling of the face, lips, or lymph nodes in your neck or under your arms. Taking this medication is only part of a total heart healthy program. Your care team may give you a special diet to follow. Avoid alcohol. Avoid smoking. Ask your care team how much you should exercise. Your care team may tell you to stop taking this medication if you develop muscle problems. If your muscle problems do not go away after stopping this medication, contact your care team. This medication can make you more sensitive to the sun. Keep out of the sun. If you cannot avoid being in the sun, wear protective clothing and sunscreen. Do not use sun lamps or tanning beds/booths. This medication may cause a decrease in vitamin B12. You should make sure that you get enough B12 while you are taking this medication. Discuss the foods you eat and the vitamins you take with your care team. What side effects may I notice from receiving this medication? Side effects that you should report to your doctor or health care professional as soon as possible: Allergic  reactions--skin rash, itching, hives, swelling of the face, lips, tongue, or throat Blood clot--pain, swelling, or warmth in the leg, shortness of breath, chest pain Gallbladder problems--severe stomach pain, nausea, vomiting, fever Liver injury--right upper belly pain, loss of appetite, nausea, light-colored stool, dark yellow or brown urine, yellowing of skin or eyes, unusual weakness or fatigue Muscle injury--unusual weakness or fatigue, muscle pain, dark yellow or brown urine, decrease in amount of urine Pancreatitis--severe stomach pain that spreads to your back or gets worse after eating or when touched, fever, nausea, vomiting Rash, fever, and swollen lymph nodes Redness, blistering, peeling, or loosening of the skin, including inside the mouth Unusual  bruising or bleeding Side effects that usually do not require medical attention (report to your doctor or health care professional if they continue or are bothersome): Back pain Constipation Headache Runny or stuffy nose This list may not describe all possible side effects. Call your doctor for medical advice about side effects. You may report side effects to FDA at 1-800-FDA-1088. Where should I keep my medication? Keep out of the reach of children and pets. Store at room temperature between 20 and 25 degrees C (68 and 77 degrees F). Protect from moisture. Keep the container tightly closed. Get rid of any unused medication after the expiration date. To get rid of medications that are no longer needed or have expired: Take the medication to a medication take-back program. Check with your pharmacy or law enforcement to find a location. If you cannot return the medication, check the label or package insert to see if the medication should be thrown out in the garbage or flushed down the toilet. If you are not sure, ask your care team. If it is safe to put it in the trash, empty the medication out of the container. Mix the medication with cat  litter, dirt, coffee grounds, or other unwanted substance. Seal the mixture in a bag or container. Put it in the trash. NOTE: This sheet is a summary. It may not cover all possible information. If you have questions about this medicine, talk to your doctor, pharmacist, or health care provider.  2022 Elsevier/Gold Standard (2020-07-04 00:00:00)

## 2021-05-29 NOTE — Progress Notes (Signed)
Cardiology Office Note:    Date:  05/29/2021   ID:  Tony Allen, DOB 01-21-1956, MRN [id number]  PCP:  Marin Comment, FNP  Cardiologist:  Garwin Brothers, MD   Referring MD: Marin Comment, FNP    ASSESSMENT:    1. Coronary artery disease involving native coronary artery of native heart without angina pectoris   2. Essential (primary) hypertension   3. Mixed hyperlipidemia   4. Diabetes mellitus without complication (HCC)    PLAN:    In order of problems listed above:  Coronary artery disease post stenting: Secondary prevention stressed with the patient.  Importance of compliance with diet medication stressed any vocalized understanding.  He was advised to walk at least half an hour a day 5 days a week and he promises to do so. Mixed dyslipidemia: On statin pravastatin and rosuvastatin at this time.  Unclear how this happened.  I told him to stop rosuvastatin.  He mentions to me that he has had no prior problems with pravastatin in the past.  Overall he looks fine.  He tells me that he just feels fatigued since the procedure.  He has no other issues no chest pain or dizziness or lightheadedness or any such problems. Essential hypertension: Blood pressure stable and diet was emphasized.  Lifestyle modification was urged Diabetes mellitus: Managed by primary care.  I explained importance of diet and reducing weight and risks of obesity was explained and he understands.  Questions were answered to his satisfaction. Patient will be seen in follow-up appointment in 3 months or earlier if the patient has any concerns    Medication Adjustments/Labs and Tests Ordered: Current medicines are reviewed at length with the patient today.  Concerns regarding medicines are outlined above.  No orders of the defined types were placed in this encounter.  No orders of the defined types were placed in this encounter.    No chief complaint on file.    History of Present Illness:    Tony Allen is a 65 y.o. male.  Patient has past medical history of coronary artery disease, essential hypertension, dyslipidemia and diabetes mellitus.  He leads a sedentary lifestyle.  He was evaluated by me and sent for coronary angiography and underwent intervention.  The details of this procedure are mentioned below.  Subsequently patient tells me that he is not willing to do well.  He has aches and pains in the body.  No specific symptoms.  He tells me that he feels tired most of the time.  It appears that he is taking 2 statins pravastatin and rosuvastatin.  He mentions to me that he has been taking pravastatin for a long time.  At the time of my evaluation, the patient is alert awake oriented and in no distress.  Past Medical History:  Diagnosis Date   Abnormal nuclear stress test    Angina pectoris (HCC) 04/02/2021   Angina, class II (HCC) 11/28/2015   Body mass index (BMI) 36.0-36.9, adult 10/02/2020   BPH (benign prostatic hyperplasia)    CAD (coronary artery disease) 05/07/2021   Chest pain    Chronic coronary artery disease 09/05/2017   Diabetes mellitus (HCC)    Diabetes mellitus without complication (HCC)    Dyslipidemia    Essential (primary) hypertension 09/05/2017   Gout    Hip pain 10/02/2020   Hypertension    Kidney stone 08/19/2016   Long-term use of aspirin therapy 09/05/2017   Lumbar radiculopathy 10/02/2020   Mixed hyperlipidemia 09/05/2017  Obstructive sleep apnea 08/19/2016   Overview:  sleep study showed sleep apnea-pt noncompliant and does not have CPAP   Peripheral polyneuropathy 10/02/2020   Shortness of breath 08/19/2016   Overview:  with activity   Tobacco abuse 03/16/2018    Past Surgical History:  Procedure Laterality Date   CAROTID STENT     COLON MASS     REMOVED 2 YEARS AGO   COLON RESECTION     CORONARY STENT INTERVENTION N/A 05/07/2021   Procedure: CORONARY STENT INTERVENTION;  Surgeon: Runell Gess, MD;  Location: [facility];   Service: Cardiovascular;  Laterality: N/A;   INTRAVASCULAR PRESSURE WIRE/FFR STUDY N/A 05/07/2021   Procedure: INTRAVASCULAR PRESSURE WIRE/FFR STUDY;  Surgeon: Runell Gess, MD;  Location: [facility];  Service: Cardiovascular;  Laterality: N/A;   LEFT HEART CATH AND CORONARY ANGIOGRAPHY N/A 05/07/2021   Procedure: LEFT HEART CATH AND CORONARY ANGIOGRAPHY;  Surgeon: Runell Gess, MD;  Location: [facility];  Service: Cardiovascular;  Laterality: N/A;    Current Medications: Current Meds  Medication Sig   allopurinol (ZYLOPRIM) 300 MG tablet Take 300 mg by mouth as needed (gout).   amLODipine (NORVASC) 10 MG tablet Take 10 mg by mouth daily.   aspirin EC 81 MG tablet Take 1 tablet (81 mg total) by mouth daily. Swallow whole.   celecoxib (CELEBREX) 200 MG capsule Take 200 mg by mouth 2 (two) times daily.   clopidogrel (PLAVIX) 75 MG tablet Take 1 tablet (75 mg total) by mouth daily.   fenofibrate (TRICOR) 145 MG tablet Take 1 tablet (145 mg total) by mouth daily.   furosemide (LASIX) 20 MG tablet Take 20 mg by mouth daily.   gabapentin (NEURONTIN) 300 MG capsule Take 300 mg by mouth 2 (two) times daily.   JARDIANCE 25 MG TABS tablet Take 25 mg by mouth every morning.   losartan (COZAAR) 100 MG tablet Take 100 mg by mouth daily.   metoprolol succinate (TOPROL-XL) 25 MG 24 hr tablet Take 25 mg by mouth daily.   nitroGLYCERIN (NITROSTAT) 0.4 MG SL tablet Place 0.4 mg under the tongue every 5 (five) minutes as needed for chest pain.   Omega-3 Fatty Acids (FISH OIL) 1000 MG CAPS Take 2 capsules (2,000 mg total) by mouth 2 (two) times daily.   omeprazole (PRILOSEC) 40 MG capsule Take 40 mg by mouth daily.   pravastatin (PRAVACHOL) 40 MG tablet Take 40 mg by mouth daily.   rosuvastatin (CRESTOR) 20 MG tablet Take 1 tablet (20 mg total) by mouth daily.   tamsulosin (FLOMAX) 0.4 MG CAPS capsule Take 0.4 mg by mouth daily.   TRULICITY 3 MG/0.5ML SOPN Inject 3 mg into the skin  once a week.     Allergies:   Patient has no known allergies.   Social History   Socioeconomic History   Marital status: Married    Spouse name: Not on file   Number of children: Not on file   Years of education: Not on file   Highest education level: Not on file  Occupational History   Not on file  Tobacco Use   Smoking status: Every Day    Types: Cigarettes   Smokeless tobacco: Never  Substance and Sexual Activity   Alcohol use: Yes    Comment: 6 BEERS WEEKLY   Drug use: Not on file   Sexual activity: Not on file  Other Topics Concern   Not on file  Social History Narrative  Not on file   Social Determinants of Health   Financial Resource Strain: Not on file  Food Insecurity: Not on file  Transportation Needs: Not on file  Physical Activity: Not on file  Stress: Not on file  Social Connections: Not on file     Family History: The patient's family history includes Colon cancer in his father; Diabetes in his mother; Hypertension in his mother; Prostate cancer in his brother.  ROS:   Please see the history of present illness.    All other systems reviewed and are negative.  EKGs/Labs/Other Studies Reviewed:    The following studies were reviewed today: Runell Gess, MD (Primary)     Procedures  CORONARY STENT INTERVENTION  INTRAVASCULAR PRESSURE WIRE/FFR STUDY  LEFT HEART CATH AND CORONARY ANGIOGRAPHY   Conclusion      Ost RCA to Prox RCA lesion is 100% stenosed.   Ost Cx to Prox Cx lesion is 75% stenosed.   Ost LM to Mid LM lesion is 30% stenosed.   A drug-eluting stent was successfully placed.   Post intervention, there is a 0% residual stenosis.   Tony Allen is a 65 y.o. male      [id number] LOCATION:  FACILITY: [facility]  PHYSICIAN: Nanetta Batty, M.D. 24-Jan-1956   Recent Labs: 05/08/2021: Hemoglobin 12.9; Platelets 214 05/26/2021: ALT 31; BUN 29; Creatinine, Ser 1.72; Potassium 4.4; Sodium 138  Recent Lipid Panel    Component  Value Date/Time   CHOL 158 05/26/2021 0956   TRIG 526 (H) 05/26/2021 0956   HDL 32 (L) 05/26/2021 0956   CHOLHDL 4.9 05/26/2021 0956   LDLCALC 49 05/26/2021 0956    Physical Exam:    VS:  BP (!) 148/88   Pulse 78   Ht 5\' 5"  (1.651 m)   Wt 211 lb 9.6 oz (96 kg)   SpO2 96%   BMI 35.21 kg/m     Wt Readings from Last 3 Encounters:  05/29/21 211 lb 9.6 oz (96 kg)  05/07/21 204 lb (92.5 kg)  05/05/21 208 lb (94.3 kg)     GEN: Patient is in no acute distress HEENT: Normal NECK: No JVD; No carotid bruits LYMPHATICS: No lymphadenopathy CARDIAC: Hear sounds regular, 2/6 systolic murmur at the apex. RESPIRATORY:  Clear to auscultation without rales, wheezing or rhonchi  ABDOMEN: Soft, non-tender, non-distended MUSCULOSKELETAL:  No edema; No deformity  SKIN: Warm and dry NEUROLOGIC:  Alert and oriented x 3 PSYCHIATRIC:  Normal affect   Signed, 05/07/21, MD  05/29/2021 3:30 PM    [facility]

## 2021-09-14 ENCOUNTER — Encounter: Payer: Self-pay | Admitting: Cardiology

## 2021-09-14 ENCOUNTER — Ambulatory Visit (INDEPENDENT_AMBULATORY_CARE_PROVIDER_SITE_OTHER): Payer: Medicaid Other | Admitting: Cardiology

## 2021-09-14 ENCOUNTER — Other Ambulatory Visit: Payer: Self-pay

## 2021-09-14 VITALS — BP 120/56 | HR 60 | Ht 65.6 in | Wt 202.0 lb

## 2021-09-14 DIAGNOSIS — I10 Essential (primary) hypertension: Secondary | ICD-10-CM

## 2021-09-14 DIAGNOSIS — Z720 Tobacco use: Secondary | ICD-10-CM

## 2021-09-14 DIAGNOSIS — E119 Type 2 diabetes mellitus without complications: Secondary | ICD-10-CM | POA: Diagnosis not present

## 2021-09-14 DIAGNOSIS — E782 Mixed hyperlipidemia: Secondary | ICD-10-CM | POA: Diagnosis not present

## 2021-09-14 DIAGNOSIS — I2510 Atherosclerotic heart disease of native coronary artery without angina pectoris: Secondary | ICD-10-CM | POA: Diagnosis not present

## 2021-09-14 DIAGNOSIS — Z1321 Encounter for screening for nutritional disorder: Secondary | ICD-10-CM

## 2021-09-14 MED ORDER — OMEGA-3-ACID ETHYL ESTERS 1 G PO CAPS: 2.0000 g | ORAL_CAPSULE | Freq: Two times a day (BID) | ORAL | 3 refills | Status: AC

## 2021-09-14 NOTE — Progress Notes (Signed)
?Cardiology Office Note:   ? ?Date:  09/14/2021  ? ?ID:  Tony Allen, DOB 06-28-1955, MRN [id number] ? ?PCP:  Marin Comment, FNP  ?Cardiologist:  Garwin Brothers, MD  ? ?Referring MD: Marin Comment, FNP  ? ? ?ASSESSMENT:   ? ?1. Coronary artery disease involving native coronary artery of native heart without angina pectoris   ?2. Essential (primary) hypertension   ?3. Mixed hyperlipidemia   ?4. Diabetes mellitus without complication (HCC)   ?5. Tobacco abuse   ? ?PLAN:   ? ?In order of problems listed above: ? ?Secondary prevention stressed to the patient.  Importance of compliance with diet medication stressed and vocalized understanding.  He was advised to walk at least half an hour a day 5 days a week and he promises to do so. ?Essential hypertension: Blood pressure stable and I discussed with him. ?Mixed dyslipidemia: Lipids reviewed and discussed with the patient.  He is on statin therapy. ?Diabetes mellitus and obesity: I discussed this with him at length and he promises to do better.  Fortunately has quit smoking.  Weight reduction stressed risks of obesity explained. ?Patient will be seen in follow-up appointment in 6 months or earlier if the patient has any concerns.  He plans to go for complete blood work in the next few days to our office including A1c and vitamin D level. ? ? ? ?Medication Adjustments/Labs and Tests Ordered: ?Current medicines are reviewed at length with the patient today.  Concerns regarding medicines are outlined above.  ?No orders of the defined types were placed in this encounter. ? ?No orders of the defined types were placed in this encounter. ? ? ? ?No chief complaint on file. ?  ? ?History of Present Illness:   ? ?Tony Allen is a 66 y.o. male, patient has past medical history of coronary artery disease, essential hypertension, dyslipidemia and diabetes mellitus.  Overall he leads a sedentary lifestyle.  Fortunately has quit smoking.  At the time of my evaluation, the  patient is alert awake oriented and in no distress. ? ?Past Medical History:  ?Diagnosis Date  ? Abnormal nuclear stress test   ? Angina pectoris (HCC) 04/02/2021  ? Angina, class II (HCC) 11/28/2015  ? Body mass index (BMI) 36.0-36.9, adult 10/02/2020  ? BPH (benign prostatic hyperplasia)   ? CAD (coronary artery disease) 05/07/2021  ? Chest pain   ? Chronic coronary artery disease 09/05/2017  ? Diabetes mellitus (HCC)   ? Diabetes mellitus without complication (HCC)   ? Dyslipidemia   ? Essential (primary) hypertension 09/05/2017  ? Gout   ? Hip pain 10/02/2020  ? Hypertension   ? Kidney stone 08/19/2016  ? Long-term use of aspirin therapy 09/05/2017  ? Lumbar radiculopathy 10/02/2020  ? Mixed hyperlipidemia 09/05/2017  ? Obstructive sleep apnea 08/19/2016  ? Overview:  sleep study showed sleep apnea-pt noncompliant and does not have CPAP  ? Peripheral polyneuropathy 10/02/2020  ? Shortness of breath 08/19/2016  ? Overview:  with activity  ? Tobacco abuse 03/16/2018  ? ? ?Past Surgical History:  ?Procedure Laterality Date  ? CAROTID STENT    ? COLON MASS    ? REMOVED 2 YEARS AGO  ? COLON RESECTION    ? CORONARY STENT INTERVENTION N/A 05/07/2021  ? Procedure: CORONARY STENT INTERVENTION;  Surgeon: Runell Gess, MD;  Location: [facility];  Service: Cardiovascular;  Laterality: N/A;  ? INTRAVASCULAR PRESSURE WIRE/FFR STUDY N/A 05/07/2021  ? Procedure: INTRAVASCULAR PRESSURE WIRE/FFR STUDY;  Surgeon: Runell Gess, MD;  Location: [facility] INVASIVE CV LAB;  Service: Cardiovascular;  Laterality: N/A;  ? LEFT HEART CATH AND CORONARY ANGIOGRAPHY N/A 05/07/2021  ? Procedure: LEFT HEART CATH AND CORONARY ANGIOGRAPHY;  Surgeon: Runell Gess, MD;  Location: [facility];  Service: Cardiovascular;  Laterality: N/A;  ? ? ?Current Medications: ?No outpatient medications have been marked as taking for the 09/14/21 encounter (Office Visit) with Forestine Macho, Aundra Dubin, MD.  ?  ? ?Allergies:   Patient has no known  allergies.  ? ?Social History  ? ?Socioeconomic History  ? Marital status: Married  ?  Spouse name: Not on file  ? Number of children: Not on file  ? Years of education: Not on file  ? Highest education level: Not on file  ?Occupational History  ? Not on file  ?Tobacco Use  ? Smoking status: Every Day  ?  Types: Cigarettes  ? Smokeless tobacco: Never  ?Substance and Sexual Activity  ? Alcohol use: Yes  ?  Comment: 6 BEERS WEEKLY  ? Drug use: Not on file  ? Sexual activity: Not on file  ?Other Topics Concern  ? Not on file  ?Social History Narrative  ? Not on file  ? ?Social Determinants of Health  ? ?Financial Resource Strain: Not on file  ?Food Insecurity: Not on file  ?Transportation Needs: Not on file  ?Physical Activity: Not on file  ?Stress: Not on file  ?Social Connections: Not on file  ?  ? ?Family History: ?The patient's family history includes Colon cancer in his father; Diabetes in his mother; Hypertension in his mother; Prostate cancer in his brother. ? ?ROS:   ?Please see the history of present illness.    ?All other systems reviewed and are negative. ? ?EKGs/Labs/Other Studies Reviewed:   ? ?The following studies were reviewed today: ?I discussed my findings with the patient at length. ? ? ?Recent Labs: ?05/08/2021: Hemoglobin 12.9; Platelets 214 ?05/26/2021: ALT 31; BUN 29; Creatinine, Ser 1.72; Potassium 4.4; Sodium 138  ?Recent Lipid Panel ?   ?Component Value Date/Time  ? CHOL 158 05/26/2021 0956  ? TRIG 526 (H) 05/26/2021 0956  ? HDL 32 (L) 05/26/2021 0956  ? CHOLHDL 4.9 05/26/2021 0956  ? LDLCALC 49 05/26/2021 0956  ? ? ?Physical Exam:   ? ?VS:  There were no vitals taken for this visit.   ? ?Wt Readings from Last 3 Encounters:  ?05/29/21 211 lb 9.6 oz (96 kg)  ?05/07/21 204 lb (92.5 kg)  ?05/05/21 208 lb (94.3 kg)  ?  ? ?GEN: Patient is in no acute distress ?HEENT: Normal ?NECK: No JVD; No carotid bruits ?LYMPHATICS: No lymphadenopathy ?CARDIAC: Hear sounds regular, 2/6 systolic murmur at the  apex. ?RESPIRATORY:  Clear to auscultation without rales, wheezing or rhonchi  ?ABDOMEN: Soft, non-tender, non-distended ?MUSCULOSKELETAL:  No edema; No deformity  ?SKIN: Warm and dry ?NEUROLOGIC:  Alert and oriented x 3 ?PSYCHIATRIC:  Normal affect  ? ?Signed, ?Garwin Brothers, MD  ?09/14/2021 4:05 PM    ?[facility]  ?

## 2021-09-14 NOTE — Patient Instructions (Signed)
Medication Instructions:  ?Your physician has recommended you make the following change in your medication:  ? ?Start Lovaza 2 gm twice daily. ? ?*If you need a refill on your cardiac medications before your next appointment, please call your pharmacy* ? ? ?Lab Work: ?Your physician recommends that you return for lab work in: the next few days. ?You need to have labs done when you are fasting.  You can come Monday through Friday 8:30 am to 12:00 pm and 1:15 to 4:30. You do not need to make an appointment as the order has already been placed. The labs you are going to have done are BMET, CBC, TSH, vitamin D, HgB A1C, LFT and Lipids. ? ? ?If you have labs (blood work) drawn today and your tests are completely normal, you will receive your results only by: ?MyChart Message (if you have MyChart) OR ?A paper copy in the mail ?If you have any lab test that is abnormal or we need to change your treatment, we will call you to review the results. ? ? ?Testing/Procedures: ?None ordered ? ? ?Follow-Up: ?At [facility], you and your health needs are our priority.  As part of our continuing mission to provide you with exceptional heart care, we have created designated Provider Care Teams.  These Care Teams include your primary Cardiologist (physician) and Advanced Practice Providers (APPs -  Physician Assistants and Nurse Practitioners) who all work together to provide you with the care you need, when you need it. ? ?We recommend signing up for the patient portal called "MyChart".  Sign up information is provided on this After Visit Summary.  MyChart is used to connect with patients for Virtual Visits (Telemedicine).  Patients are able to view lab/test results, encounter notes, upcoming appointments, etc.  Non-urgent messages can be sent to your provider as well.   ?To learn more about what you can do with MyChart, go to [email or web address].   ? ?Your next appointment:   ?9 month(s) ? ?The format for your next  appointment:   ?In Person ? ?Provider:   ?Belva Crome, MD ? ? ?Other Instructions ?NA ? ?

## 2021-09-16 ENCOUNTER — Telehealth: Payer: Self-pay

## 2021-09-16 LAB — HEPATIC FUNCTION PANEL
ALT: 21 IU/L (ref 0–44)
AST: 18 IU/L (ref 0–40)
Albumin: 4.2 g/dL (ref 3.8–4.8)
Alkaline Phosphatase: 84 IU/L (ref 44–121)
Bilirubin Total: 0.3 mg/dL (ref 0.0–1.2)
Bilirubin, Direct: <0.1 mg/dL (ref 0.00–0.40)
Total Protein: 6.9 g/dL (ref 6.0–8.5)

## 2021-09-16 LAB — CBC WITH DIFFERENTIAL/PLATELET
Basophils Absolute: 0.1 10*3/uL (ref 0.0–0.2)
Basos: 1 %
EOS (ABSOLUTE): 0.3 10*3/uL (ref 0.0–0.4)
Eos: 4 %
Hematocrit: 40.5 % (ref 37.5–51.0)
Hemoglobin: 13.9 g/dL (ref 13.0–17.7)
Immature Grans (Abs): 0.1 10*3/uL (ref 0.0–0.1)
Immature Granulocytes: 1 %
Lymphocytes Absolute: 2 10*3/uL (ref 0.7–3.1)
Lymphs: 26 %
MCH: 29.8 pg (ref 26.6–33.0)
MCHC: 34.3 g/dL (ref 31.5–35.7)
MCV: 87 fL (ref 79–97)
Monocytes Absolute: 0.6 10*3/uL (ref 0.1–0.9)
Monocytes: 8 %
Neutrophils Absolute: 4.7 10*3/uL (ref 1.4–7.0)
Neutrophils: 60 %
Platelets: 245 10*3/uL (ref 150–450)
RBC: 4.67 x10E6/uL (ref 4.14–5.80)
RDW: 13.5 % (ref 11.6–15.4)
WBC: 7.7 10*3/uL (ref 3.4–10.8)

## 2021-09-16 LAB — LIPID PANEL
Chol/HDL Ratio: 5 ratio (ref 0.0–5.0)
Cholesterol, Total: 191 mg/dL (ref 100–199)
HDL: 38 mg/dL — ABNORMAL LOW (ref >39)
LDL Chol Calc (NIH): 90 mg/dL (ref 0–99)
Triglycerides: 379 mg/dL — ABNORMAL HIGH (ref 0–149)
VLDL Cholesterol Cal: 63 mg/dL — ABNORMAL HIGH (ref 5–40)

## 2021-09-16 LAB — VITAMIN D 25 HYDROXY (VIT D DEFICIENCY, FRACTURES): Vit D, 25-Hydroxy: 13.5 ng/mL — ABNORMAL LOW (ref 30.0–100.0)

## 2021-09-16 LAB — BASIC METABOLIC PANEL
BUN/Creatinine Ratio: 21 (ref 10–24)
BUN: 51 mg/dL — ABNORMAL HIGH (ref 8–27)
CO2: 21 mmol/L (ref 20–29)
Calcium: 10.5 mg/dL — ABNORMAL HIGH (ref 8.6–10.2)
Chloride: 103 mmol/L (ref 96–106)
Creatinine, Ser: 2.45 mg/dL — ABNORMAL HIGH (ref 0.76–1.27)
Glucose: 162 mg/dL — ABNORMAL HIGH (ref 70–99)
Potassium: 5.1 mmol/L (ref 3.5–5.2)
Sodium: 138 mmol/L (ref 134–144)
eGFR: 29 mL/min/{1.73_m2} — ABNORMAL LOW (ref >59)

## 2021-09-16 LAB — HEMOGLOBIN A1C
Est. average glucose Bld gHb Est-mCnc: 203 mg/dL
Hgb A1c MFr Bld: 8.7 % — ABNORMAL HIGH (ref 4.8–5.6)

## 2021-09-16 LAB — TSH: TSH: 2.14 u[IU]/mL (ref 0.450–4.500)

## 2021-09-16 NOTE — Telephone Encounter (Signed)
PA sent to [facility] for Lovaza. Key [id number] ?

## 2021-09-17 ENCOUNTER — Other Ambulatory Visit: Payer: Self-pay

## 2021-09-17 DIAGNOSIS — I209 Angina pectoris, unspecified: Secondary | ICD-10-CM

## 2021-09-17 MED ORDER — FUROSEMIDE 20 MG PO TABS
20.0000 mg | ORAL_TABLET | Freq: Every day | ORAL | 0 refills | Status: AC
Start: 2021-09-17 — End: ?

## 2021-09-17 MED ORDER — LOSARTAN POTASSIUM 100 MG PO TABS
100.0000 mg | ORAL_TABLET | Freq: Every day | ORAL | 3 refills | Status: DC
Start: 2021-09-17 — End: 2022-11-01

## 2021-09-17 MED ORDER — PANTOPRAZOLE SODIUM 40 MG PO TBEC: 40.0000 mg | DELAYED_RELEASE_TABLET | Freq: Every day | ORAL | 3 refills | Status: DC

## 2021-09-17 MED ORDER — PRAVASTATIN SODIUM 40 MG PO TABS: 40.0000 mg | ORAL_TABLET | Freq: Every day | ORAL | 3 refills | Status: AC

## 2021-09-17 MED ORDER — FENOFIBRATE 145 MG PO TABS: 145.0000 mg | ORAL_TABLET | Freq: Every day | ORAL | 3 refills | Status: AC

## 2021-09-17 MED ORDER — CARVEDILOL 25 MG PO TABS: 25.0000 mg | ORAL_TABLET | Freq: Two times a day (BID) | ORAL | 3 refills | Status: DC

## 2021-09-17 MED ORDER — CLOPIDOGREL BISULFATE 75 MG PO TABS: 75.0000 mg | ORAL_TABLET | Freq: Every day | ORAL | 3 refills | Status: AC

## 2021-09-17 NOTE — Telephone Encounter (Signed)
PA approved from 09/14/21 until further notice on CMM for Lovaza. Approved. This drug has been approved. Approved quantity: 360 capsules per 90 day(s). You may fill up to a 34 day supply at a retail pharmacy. You may fill up to a 90 day supply for maintenance drugs,  ?

## 2022-01-02 IMAGING — CT CT CHEST W/O CM
2 of 3 series · 15 of 36 positions shown, 18 images · non-contrast
Comparison: None.

CLINICAL DATA: Evaluate ascending thoracic aorta

EXAM:
CT CHEST WITHOUT CONTRAST
TECHNIQUE: Multidetector CT imaging of the chest was performed following the
standard protocol without IV contrast.

[Series 2: thorax · axial · 0.83mm/px · z∈[-353,-77]mm · 12 of 164 slices shown, 15 images]
[im 13/164  mediastinal]
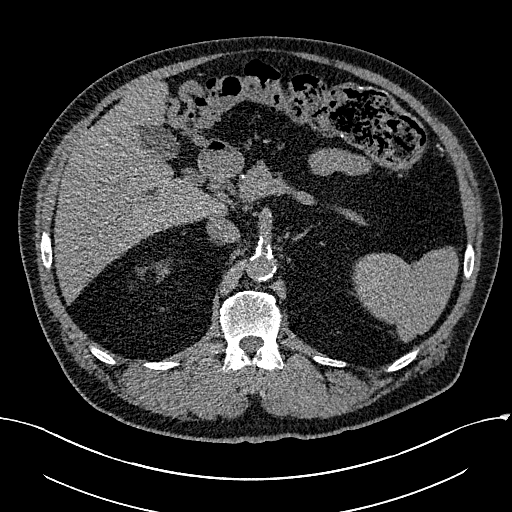
[im 13/164  lung]
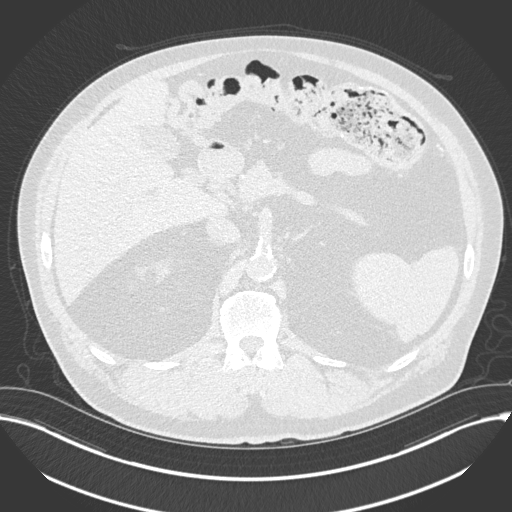
[im 25/164  lung]
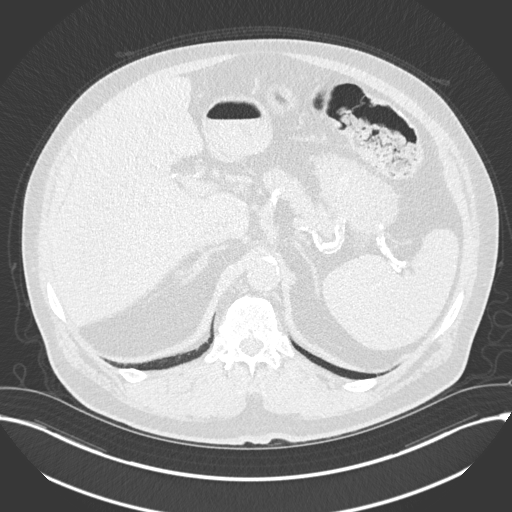
[im 37/164  lung]
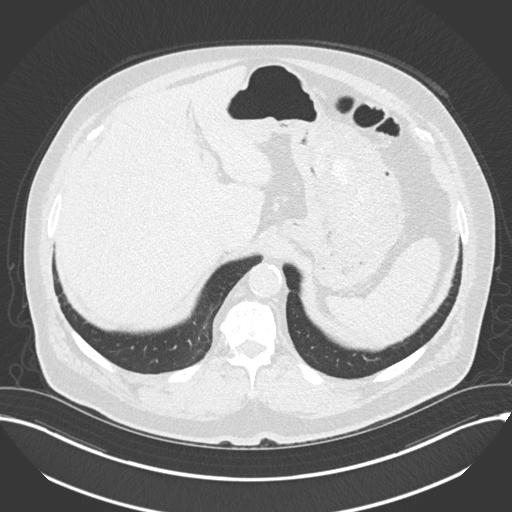
[im 49/164  lung]
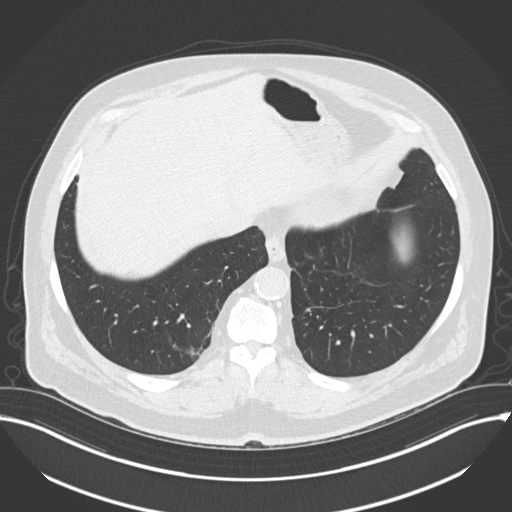
[im 61/164  mediastinal]
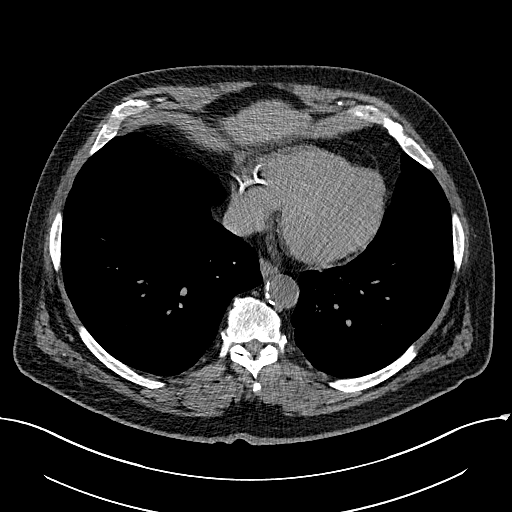
[im 61/164  lung]
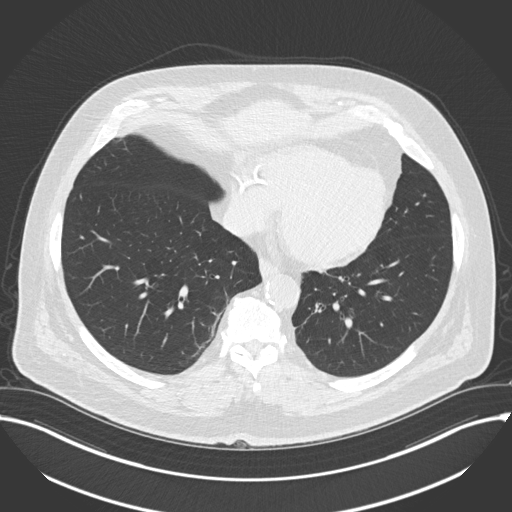
[im 73/164  lung]
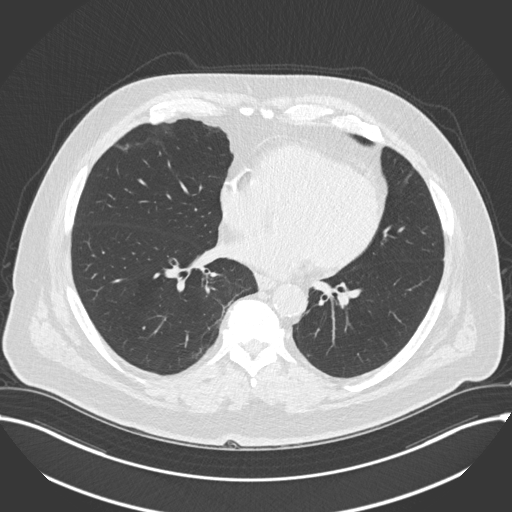
[im 91/164  lung]
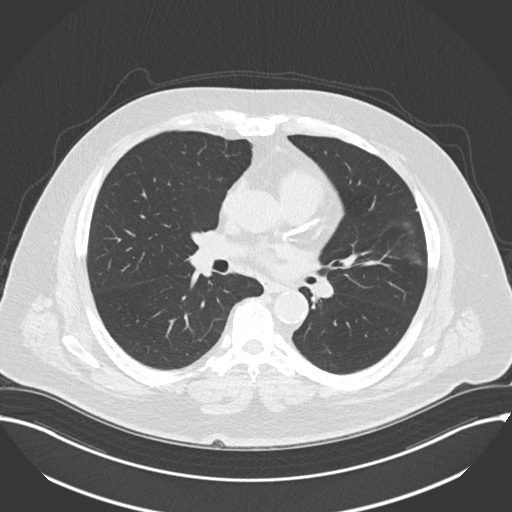
[im 103/164  lung]
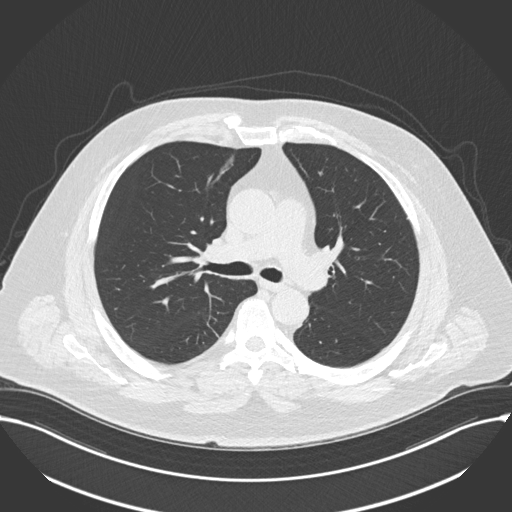
[im 115/164  mediastinal]
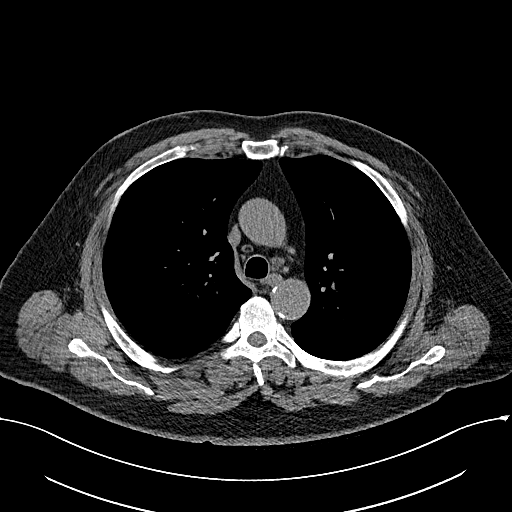
[im 115/164  lung]
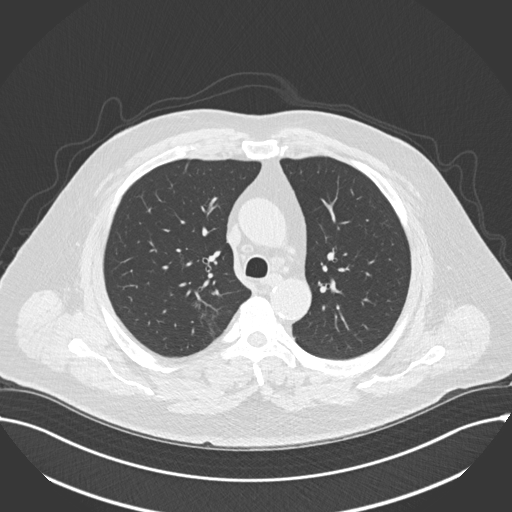
[im 127/164  lung]
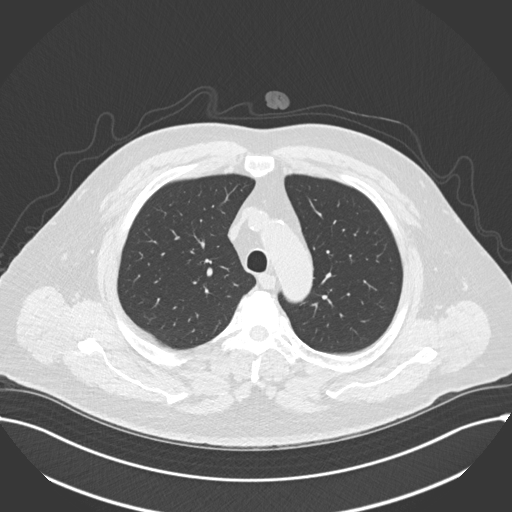
[im 139/164  lung]
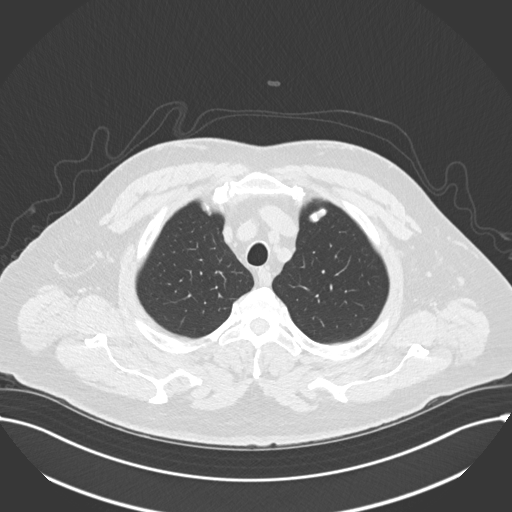
[im 151/164  lung]
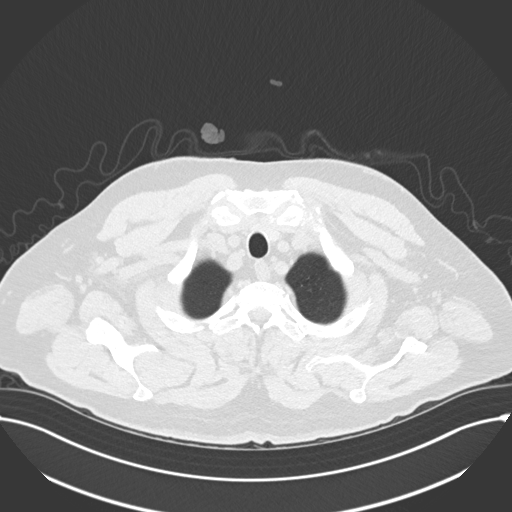

[Series 5: coronal · coronal · 0.68mm/px · 3 of 155 slices shown]
[im 31/155  lung]
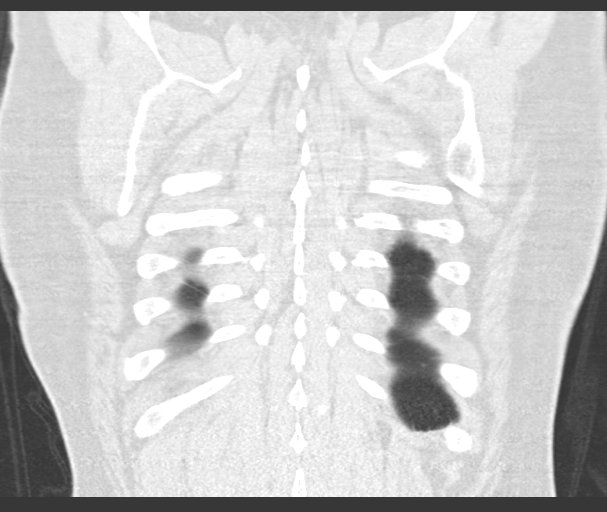
[im 62/155  lung]
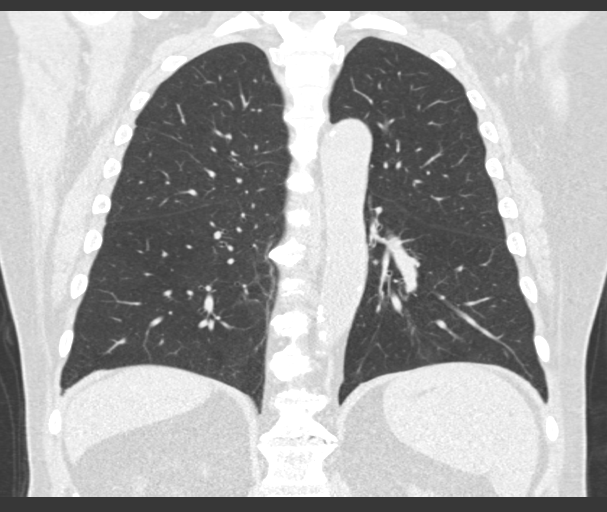
[im 93/155  lung]
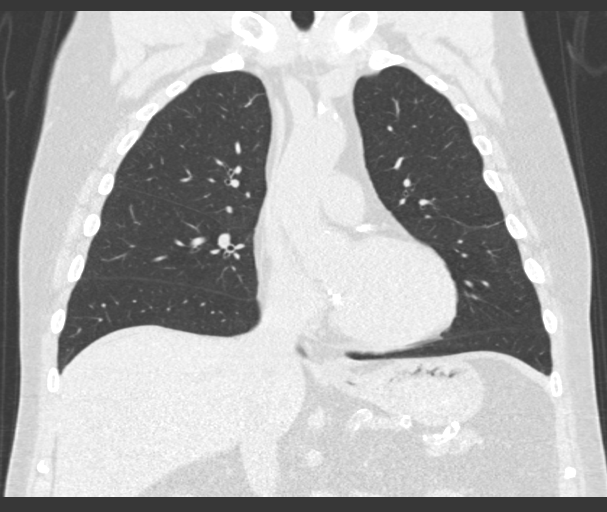

[15 of 36 positions shown; findings below may reference images not displayed]

FINDINGS: Cardiovascular: Ascending thoracic aorta is normal diameter 36 mm.
Normal great vessels. Intimal calcification through the transverse
arch of the aorta. Dense calcification of the coronary arteries.

Mediastinum/Nodes: No axillary or supraclavicular adenopathy. No
mediastinal or hilar adenopathy. No pericardial fluid. Esophagus
normal.

Lungs/Pleura: No suspicious pulmonary nodules. Normal pleural.
Airways normal.

Upper Abdomen: Limited view of the liver, kidneys, pancreas are
unremarkable. Normal adrenal glands.

Musculoskeletal: No aggressive osseous lesion.
IMPRESSION: 1. Normal diameter of ascending thoracic aorta (36 mm).
2. Three-vessel coronary artery calcification and Aortic
Atherosclerosis ([id number]).

## 2022-06-20 ENCOUNTER — Other Ambulatory Visit: Payer: Self-pay | Admitting: Cardiology

## 2022-10-10 ENCOUNTER — Other Ambulatory Visit: Payer: Self-pay | Admitting: Cardiology

## 2022-10-11 ENCOUNTER — Other Ambulatory Visit: Payer: Self-pay | Admitting: Cardiology

## 2022-10-31 ENCOUNTER — Other Ambulatory Visit: Payer: Self-pay | Admitting: Cardiology

## 2022-11-15 ENCOUNTER — Other Ambulatory Visit: Payer: Self-pay | Admitting: Cardiology

## 2022-11-30 ENCOUNTER — Other Ambulatory Visit: Payer: Self-pay | Admitting: Cardiology

## 2022-12-13 ENCOUNTER — Other Ambulatory Visit: Payer: Self-pay | Admitting: Cardiology

## 2022-12-14 ENCOUNTER — Other Ambulatory Visit: Payer: Self-pay | Admitting: Cardiology

## 2023-01-17 ENCOUNTER — Other Ambulatory Visit: Payer: Self-pay | Admitting: Cardiology

## 2023-01-30 ENCOUNTER — Other Ambulatory Visit: Payer: Self-pay | Admitting: Cardiology

## 2023-02-01 ENCOUNTER — Other Ambulatory Visit: Payer: Self-pay | Admitting: Cardiology
